# Patient Record
Sex: Male | Born: 1970 | Race: White | Hispanic: No | Marital: Married | Smoking: Former smoker
Health system: Southern US, Community
[De-identification: ages and names within clinical notes are randomized; demographics above are authoritative.]

## PROBLEM LIST (undated history)

## (undated) DIAGNOSIS — K829 Disease of gallbladder, unspecified: Secondary | ICD-10-CM

## (undated) HISTORY — PX: OTHER SURGICAL HISTORY: SHX169

## (undated) HISTORY — PX: JOINT REPLACEMENT: SHX530

## (undated) HISTORY — PX: EYE SURGERY: SHX253

## (undated) HISTORY — DX: Disease of gallbladder, unspecified: K82.9

---

## 2007-08-30 HISTORY — PX: ESOPHAGOGASTRODUODENOSCOPY: SHX1529

## 2018-05-08 DIAGNOSIS — M79644 Pain in right finger(s): Secondary | ICD-10-CM | POA: Diagnosis not present

## 2018-06-26 ENCOUNTER — Ambulatory Visit: Payer: BLUE CROSS/BLUE SHIELD | Admitting: Family Medicine

## 2018-06-26 ENCOUNTER — Encounter: Payer: Self-pay | Admitting: Family Medicine

## 2018-06-26 ENCOUNTER — Ambulatory Visit (INDEPENDENT_AMBULATORY_CARE_PROVIDER_SITE_OTHER): Payer: BLUE CROSS/BLUE SHIELD

## 2018-06-26 VITALS — BP 130/80 | HR 86 | Ht 73.0 in | Wt 174.0 lb

## 2018-06-26 DIAGNOSIS — S6991XA Unspecified injury of right wrist, hand and finger(s), initial encounter: Secondary | ICD-10-CM

## 2018-06-26 DIAGNOSIS — M79644 Pain in right finger(s): Secondary | ICD-10-CM | POA: Diagnosis not present

## 2018-06-26 MED ORDER — DICLOFENAC SODIUM 1 % TD GEL: TRANSDERMAL | 1 refills | Status: DC

## 2018-06-26 NOTE — Progress Notes (Signed)
   Subjective:  Patient ID: Clinton Pope, male    DOB: 1971/05/28  Age: 47 y.o. MRN: [id number]  CC: Establish Care   HPI Clinton Pope presents for treatment and evaluation for pain in his right middle PIP joint.  There was no injury to this joint that he can think of.  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  He was evaluated for this back in June.  X-rays were normal.  He is taken time off from golfing and then returned only to find that it was difficult to complete his round secondary to the pain.  It sounds as though he took a course of prednisone for the pain that did help at the time.  This is been going on for 4 months for him without resolve.   No outpatient medications prior to visit.   No facility-administered medications prior to visit.     ROS Review of Systems  Respiratory: Negative.   Cardiovascular: Negative.   Gastrointestinal: Negative.   Musculoskeletal: Positive for arthralgias.  Skin: Negative for pallor and rash.  Neurological: Negative for weakness and numbness.  Hematological: Negative.   Psychiatric/Behavioral: Negative.     Objective:  BP 130/80   Pulse 86   Ht 6\' 1"  (1.854 m)   Wt 174 lb (78.9 kg)   SpO2 95%   BMI 22.96 kg/m   BP Readings from Last 3 Encounters:  06/26/18 130/80    Wt Readings from Last 3 Encounters:  06/26/18 174 lb (78.9 kg)    Physical Exam  Constitutional: He is oriented to person, place, and time. He appears well-developed and well-nourished. No distress.  HENT:  Head: Normocephalic and atraumatic.  Right Ear: External ear normal.  Left Ear: External ear normal.  Eyes: Right eye exhibits no discharge. Left eye exhibits no discharge. No scleral icterus.  Pulmonary/Chest: Effort normal.  Musculoskeletal:       Hands: Neurological: He is alert and oriented to person, place, and time.  Skin: Skin is warm and dry. Capillary refill takes less than 2 seconds. No rash noted. He is not diaphoretic.    Psychiatric: He has a normal mood and affect. His behavior is normal.    No results found for: WBC, HGB, HCT, PLT, GLUCOSE, CHOL, TRIG, HDL, LDLDIRECT, LDLCALC, ALT, AST, NA, K, CL, CREATININE, BUN, CO2, TSH, PSA, INR, GLUF, HGBA1C, MICROALBUR  Patient was never admitted.  Assessment & Plan:   Clinton Pope was seen today for establish care.  Diagnoses and all orders for this visit:  Injury of right middle finger, initial encounter -     DG Finger Middle Right; Future -     diclofenac sodium (VOLTAREN) 1 % GEL; Apply a small amount to tender spot on right middle finger 4 times daily as needed. -     Ambulatory referral to Hand Surgery -     DG Finger Middle Right   I am having Clinton Pope start on diclofenac sodium.  Meds ordered this encounter  Medications  . diclofenac sodium (VOLTAREN) 1 % GEL    Sig: Apply a small amount to tender spot on right middle finger 4 times daily as needed.    Dispense:  100 g    Refill:  1     Follow-up: Return return for physical exam. .  Libby Maw, MD

## 2018-06-26 NOTE — Patient Instructions (Signed)
Finger Sprain A finger sprain is an injury to one of the strong bands of tissue (ligaments) that connect the bones in the finger. The ligament can be stretched too much, or it can tear. A tear can be either partial or complete. The severity of the sprain depends on how much of the ligament was damaged or torn. CAUSES This injury is often caused by a fall or an accident. For example, if you extend your hands to catch an object or to protect yourself during a fall, the force of impact may cause the ligaments in your finger to stretch too much. RISK FACTORS The following factors may make you more likely to have this injury:  Playing sports that involve a greater risk of falling, such as skiing.  Playing sports that involve catching an object, such as basketball.  Having poor strength and flexibility. SYMPTOMS Symptoms of this condition include:  Pain at the affected finger joint, especially when bending or extending the finger.  Loss of motion in the finger.  Swelling.  Tenderness.  Bruising. DIAGNOSIS This condition is diagnosed with a medical history and physical exam. You may also have an X-ray of your finger to rule out a fracture or dislocation. TREATMENT Treatment varies depending on the severity of the sprain. If your ligament is overstretched or partially torn, treatment usually involves:  Keeping the finger in a fixed position (immobilization) for a period of time. To help you do this, your health care provider may apply a bandage, splint, or cast to keep the finger from moving until it heals. In some cases, the finger may be taped to the fingers beside it (buddy taping).  Taking medicines for pain.  Doing exercises for the finger after it has begun to heal. If your ligament is fully torn, you may need surgery to reconnect the ligament to the bone. After surgery, a cast or splint will be applied. HOME CARE INSTRUCTIONS If You Have a Splint:  Wear the splint as told by your  health care provider. Remove it only as told by your health care provider.  Loosen the splint if your fingers tingle, become numb, or turn cold and blue.  Do not let your splint get wet if it is not waterproof.  Keep the splint clean. If You Have a Cast:  Do not stick anything inside the cast to scratch your skin. Doing that increases your risk of infection.  Check the skin around the cast every day. Tell your health care provider about any concerns.  You may put lotion on dry skin around the edges of the cast. Do not put lotion on the skin underneath the cast.  Do not let your cast get wet if it is not waterproof.  Keep the cast clean. Bathing  If your splint or cast is not waterproof, cover it with a watertight plastic bag when you take a bath or a shower.  Keep any bandages (dressings) dry until your health care provider says they can be removed. Managing Pain, Stiffness, and Swelling  If directed, put ice on the injured area: ? Put ice in a plastic bag. ? Place a towel between your skin and the bag. ? Leave the ice on for 20 minutes, 2-3 times a day.  Move your fingers often to avoid stiffness and to lessen swelling.  Raise (elevate) the injured area above the level of your heart while you are sitting or lying down. General Instructions  Do not put pressure on any part of the  cast or splint until it is fully hardened. This may take several hours.  Take over-the-counter and prescription medicines only as told by your health care provider.  Do not drive or operate heavy machinery while taking prescription pain medicine.  Do exercises as told by your health care provider or physical therapist.  Do not wear rings on your injured finger.  Keep all follow-up visits as told by your health care provider. This is important. SEEK MEDICAL CARE IF:  Your pain is not controlled with medicine.  Your bruising or swelling gets worse.  Your cast or splint is damaged.  Your  finger is numb or blue.  Your finger feels colder than normal. This information is not intended to replace advice given to you by your health care provider. Make sure you discuss any questions you have with your health care provider. Document Released: 09/22/2004 Document Revised: 12/07/2015 Document Reviewed: 06/25/2015 Elsevier Interactive Patient Education  2017 [employer].

## 2018-07-02 ENCOUNTER — Encounter (INDEPENDENT_AMBULATORY_CARE_PROVIDER_SITE_OTHER): Payer: Self-pay | Admitting: Family Medicine

## 2018-07-02 ENCOUNTER — Ambulatory Visit (INDEPENDENT_AMBULATORY_CARE_PROVIDER_SITE_OTHER): Payer: BLUE CROSS/BLUE SHIELD | Admitting: Family Medicine

## 2018-07-02 DIAGNOSIS — M79644 Pain in right finger(s): Secondary | ICD-10-CM

## 2018-07-02 NOTE — Progress Notes (Signed)
   Office Visit Note   Patient: Clinton Pope           Date of Birth: [birth year]           MRN: [id number] Visit Date: 07/02/2018 Requested by: Libby Maw, [address] [place], [place] [address] PCP: Libby Maw, MD  Subjective: Chief Complaint  Patient presents with  . Right Middle Finger - Pain    Pain since June 2019.  NKI.  Pain with gripping objects (carrying groceries, gripping a golf club).    HPI: He is a 47 year old right-hand-dominant male with right third finger pain.  Symptoms started 3 or 4 months ago without injury.  Pain on the ulnar side of his third finger PIP joint.  It hurts to squeeze objects, or when his finger is jerked in radial direction.  Occasional stiffness in his finger.  He tried prednisone for 12 days with temporary improvement.  No previous problems with his finger, denies any numbness or tingling.  He works in Engineer, technical sales.  He plays golf as well.  He stopped golf for a month but it did not help his pain.  He had x-rays done recently.              ROS: Negative for rheumatologic disease.  Objective: Vital Signs: There were no vitals taken for this visit.  Physical Exam:  Right hand: Third finger has full active range of motion, no synovitis.  He is tender to palpation on the radial side of the PIP joint.  He has pain with stress of the radial collateral ligament but no laxity.  No triggering at the A1 pulley.  Imaging: Plain x-rays reviewed and were unremarkable.  Ultrasound images obtained showing a probable partial tear of the radial collateral ligament of the PIP joint at the middle phalanx.  Question tiny avulsion fragment as well.  Assessment & Plan: 1.  Right third finger pain, suspect sprain of the radial collateral ligament PIP joint with small partial tear -We will try silicone sleeve over the joint and buddy tape to the fourth finger for the next 3 to 4 weeks.  If symptoms improve he will then try to resume his  normal activities.  If pain recurs, then possibly occupational/hand therapy referral.   Follow-Up Instructions: No follow-ups on file.       Procedures: None today.   PMFS History: There are no active problems to display for this patient.  History reviewed. No pertinent past medical history.  Family History  Problem Relation Age of Onset  . Cancer Mother   . Early death Mother   . Early death Father     History reviewed. No pertinent surgical history. Social History   Occupational History  . Not on file  Tobacco Use  . Smoking status: Current Every Day Smoker  . Smokeless tobacco: Never Used  Substance and Sexual Activity  . Alcohol use: Yes  . Drug use: Never  . Sexual activity: Yes    Partners: Female

## 2018-07-12 ENCOUNTER — Encounter: Payer: Self-pay | Admitting: Family Medicine

## 2018-07-12 ENCOUNTER — Ambulatory Visit (INDEPENDENT_AMBULATORY_CARE_PROVIDER_SITE_OTHER): Payer: BLUE CROSS/BLUE SHIELD | Admitting: Family Medicine

## 2018-07-12 VITALS — BP 110/80 | HR 82 | Ht 73.0 in | Wt 172.1 lb

## 2018-07-12 DIAGNOSIS — Z720 Tobacco use: Secondary | ICD-10-CM | POA: Diagnosis not present

## 2018-07-12 DIAGNOSIS — Z0001 Encounter for general adult medical examination with abnormal findings: Secondary | ICD-10-CM | POA: Diagnosis not present

## 2018-07-12 DIAGNOSIS — H6123 Impacted cerumen, bilateral: Secondary | ICD-10-CM | POA: Diagnosis not present

## 2018-07-12 LAB — LIPID PANEL
CHOL/HDL RATIO: 6
Cholesterol: 220 mg/dL — ABNORMAL HIGH (ref 0–200)
HDL: 34 mg/dL — ABNORMAL LOW (ref >39.00)
NonHDL: 186.21
Triglycerides: 209 mg/dL — ABNORMAL HIGH (ref 0.0–149.0)
VLDL: 41.8 mg/dL — AB (ref 0.0–40.0)

## 2018-07-12 LAB — COMPREHENSIVE METABOLIC PANEL
ALK PHOS: 63 U/L (ref 39–117)
ALT: 25 U/L (ref 0–53)
AST: 18 U/L (ref 0–37)
Albumin: 4.5 g/dL (ref 3.5–5.2)
BUN: 12 mg/dL (ref 6–23)
CO2: 26 mEq/L (ref 19–32)
CREATININE: 0.91 mg/dL (ref 0.40–1.50)
Calcium: 9.4 mg/dL (ref 8.4–10.5)
Chloride: 107 mEq/L (ref 96–112)
GFR: 94.11 mL/min (ref >60.00)
GLUCOSE: 95 mg/dL (ref 70–99)
POTASSIUM: 4.3 meq/L (ref 3.5–5.1)
SODIUM: 142 meq/L (ref 135–145)
TOTAL PROTEIN: 6.9 g/dL (ref 6.0–8.3)
Total Bilirubin: 0.5 mg/dL (ref 0.2–1.2)

## 2018-07-12 LAB — URINALYSIS, ROUTINE W REFLEX MICROSCOPIC
Bilirubin Urine: NEGATIVE
HGB URINE DIPSTICK: NEGATIVE
Ketones, ur: NEGATIVE
Leukocytes, UA: NEGATIVE
Nitrite: NEGATIVE
Specific Gravity, Urine: 1.015 (ref 1.000–1.030)
TOTAL PROTEIN, URINE-UPE24: NEGATIVE
URINE GLUCOSE: NEGATIVE
UROBILINOGEN UA: 0.2 (ref 0.0–1.0)
WBC UA: NONE SEEN — AB (ref 0–?)
pH: 7 (ref 5.0–8.0)

## 2018-07-12 LAB — CBC
HEMATOCRIT: 47.6 % (ref 39.0–52.0)
Hemoglobin: 16.6 g/dL (ref 13.0–17.0)
MCHC: 34.9 g/dL (ref 30.0–36.0)
MCV: 89.3 fl (ref 78.0–100.0)
Platelets: 279 10*3/uL (ref 150.0–400.0)
RBC: 5.33 Mil/uL (ref 4.22–5.81)
RDW: 13.8 % (ref 11.5–15.5)
WBC: 8.1 10*3/uL (ref 4.0–10.5)

## 2018-07-12 LAB — LDL CHOLESTEROL, DIRECT: LDL DIRECT: 153 mg/dL

## 2018-07-12 LAB — TSH: TSH: 1.51 u[IU]/mL (ref 0.35–4.50)

## 2018-07-12 MED ORDER — CARBAMIDE PEROXIDE 6.5 % OT SOLN: 5.0000 [drp] | Freq: Two times a day (BID) | OTIC | 0 refills | Status: DC

## 2018-07-12 NOTE — Progress Notes (Signed)
Subjective:  Patient ID: Clinton Pope, male    DOB: Jun 17, 1971  Age: 47 y.o. MRN: [id number]  CC: Annual Exam   HPI Clinton Pope presents for a physical exam.  He is fasting today.  He lives with his wife.  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  His main physical activity is golfing.  He likes to walk.  He does not have a dedicated daily exercise program.  He has been unable to golf recently because of the weather and his above-stated problem with his right middle finger.  He is right-hand dominant.  He rarely if ever smokes alcohol.  He does not use illicit drugs.  He has been smoking about a pack of cigarettes a day for the last 30 years.  His wife also smokes.  He is not motivated to stop smoking at this time.  He has not tolerated nicotine lozenges or gum.  His mom died of breast cancer.  His father died at age 75 from a ruptured AAA.    Outpatient Medications Prior to Visit  Medication Sig Dispense Refill  . diclofenac sodium (VOLTAREN) 1 % GEL Apply a small amount to tender spot on right middle finger 4 times daily as needed. 100 g 1   No facility-administered medications prior to visit.     ROS Review of Systems  Constitutional: Negative for chills, diaphoresis, fatigue, fever and unexpected weight change.  HENT: Positive for congestion and postnasal drip. Negative for sinus pressure and sinus pain.   Eyes: Negative for photophobia and visual disturbance.  Respiratory: Negative for cough, shortness of breath and wheezing.   Cardiovascular: Negative for chest pain and palpitations.  Gastrointestinal: Negative.   Endocrine: Negative for polyphagia.  Genitourinary: Negative for difficulty urinating and frequency.  Musculoskeletal: Positive for arthralgias. Negative for joint swelling.  Skin: Negative for pallor and rash.  Allergic/Immunologic: Negative for immunocompromised state.  Neurological: Negative for speech difficulty, numbness and headaches.    Hematological: Does not bruise/bleed easily.  Psychiatric/Behavioral: Negative.     Objective:  BP 110/80 (BP Location: Left Arm, Patient Position: Sitting, Cuff Size: Normal)   Pulse 82   Ht _0  (1.854 m)   Wt 172 lb 2 oz (78.1 kg)   SpO2 97%   BMI 22.71 kg/m   BP Readings from Last 3 Encounters:  07/12/18 110/80  06/26/18 130/80    Wt Readings from Last 3 Encounters:  07/12/18 172 lb 2 oz (78.1 kg)  06/26/18 174 lb (78.9 kg)    Physical Exam  Constitutional: He is oriented to person, place, and time. He appears well-developed and well-nourished. No distress.  HENT:  Head: Normocephalic and atraumatic.  Right Ear: External ear normal. A foreign body is present.  Left Ear: External ear normal. A foreign body is present.  Ears:  Mouth/Throat: Oropharynx is clear and moist. No oropharyngeal exudate.  Eyes: Pupils are equal, round, and reactive to light. Conjunctivae and EOM are normal. Right eye exhibits no discharge. Left eye exhibits no discharge. No scleral icterus.  Neck: Neck supple. No JVD present. No tracheal deviation present. No thyromegaly present.  Cardiovascular: Normal rate, regular rhythm and normal heart sounds.  Pulmonary/Chest: Effort normal and breath sounds normal.  Abdominal: Soft. Bowel sounds are normal. He exhibits no distension and no mass. There is no tenderness. There is no rebound and no guarding. No hernia. Hernia confirmed negative in the right inguinal area and confirmed negative in the left inguinal area.  Genitourinary:  Testes normal and penis normal. Right testis shows no mass, no swelling and no tenderness. Left testis shows no mass, no swelling and no tenderness. Circumcised. No hypospadias, penile erythema or penile tenderness. No discharge found.  Musculoskeletal: He exhibits no edema.  Lymphadenopathy:    He has no cervical adenopathy. No inguinal adenopathy noted on the right or left side.  Neurological: He is alert and oriented to  person, place, and time.  Skin: Skin is warm and dry. Capillary refill takes less than 2 seconds. No rash noted. He is not diaphoretic. No erythema. No pallor.  Psychiatric: He has a normal mood and affect. His behavior is normal.    No results found for: WBC, HGB, HCT, PLT, GLUCOSE, CHOL, TRIG, HDL, LDLDIRECT, LDLCALC, ALT, AST, NA, K, CL, CREATININE, BUN, CO2, TSH, PSA, INR, GLUF, HGBA1C, MICROALBUR  Patient was never admitted.  Assessment & Plan:   [name] was seen today for annual exam.  Diagnoses and all orders for this visit:  Encounter for health maintenance examination with abnormal findings -     CBC -     Comprehensive metabolic panel -     HIV Antibody (routine testing w rflx) -     Lipid panel -     Urinalysis, Routine w reflex microscopic -     TSH  Ceruminosis, bilateral -     carbamide peroxide (EAR WAX REMOVAL KIT) 6.5 % OTIC solution; Place 5 drops into both ears 2 (two) times daily.  Tobacco use   I am having [name] start on carbamide peroxide. I am also having him maintain his diclofenac sodium.  Meds ordered this encounter  Medications  . carbamide peroxide (EAR WAX REMOVAL KIT) 6.5 % OTIC solution    Sig: Place 5 drops into both ears 2 (two) times daily.    Dispense:  15 mL    Refill:  0   Patient was given anticipatory guidance for health maintenance and disease prevention.  Encouraged him to begin a dedicated exercise program 30 minutes 5 days a week.  He was advised to obtain an earwax removal kit and start using the drops daily.  Information was given to him on cerumen buildup and avoiding Q-tips.  He was given information on the health risks of smoking and coping with quitting.  We discussed the use of Chantix and how it could help him.  He agrees that he and his wife together have to make a joint decision to stop smoking.  Suggested follow-up date will pending laboratory results.  Follow-up: No follow-ups on file.  Libby Maw,  MD

## 2018-07-12 NOTE — Patient Instructions (Signed)
Earwax Buildup, Adult The ears produce a substance called earwax that helps keep bacteria out of the ear and protects the skin in the ear canal. Occasionally, earwax can build up in the ear and cause discomfort or hearing loss. What increases the risk? This condition is more likely to develop in people who:  Are male.  Are elderly.  Naturally produce more earwax.  Clean their ears often with cotton swabs.  Use earplugs often.  Use in-ear headphones often.  Wear hearing aids.  Have narrow ear canals.  Have earwax that is overly thick or sticky.  Have eczema.  Are dehydrated.  Have excess hair in the ear canal.  What are the signs or symptoms? Symptoms of this condition include:  Reduced or muffled hearing.  A feeling of fullness in the ear or feeling that the ear is plugged.  Fluid coming from the ear.  Ear pain.  Ear itch.  Ringing in the ear.  Coughing.  An obvious piece of earwax that can be seen inside the ear canal.  How is this diagnosed? This condition may be diagnosed based on:  Your symptoms.  Your medical history.  An ear exam. During the exam, your health care provider will look into your ear with an instrument called an otoscope.  You may have tests, including a hearing test. How is this treated? This condition may be treated by:  Using ear drops to soften the earwax.  Having the earwax removed by a health care provider. The health care provider may: ? Flush the ear with water. ? Use an instrument that has a loop on the end (curette). ? Use a suction device.  Surgery to remove the wax buildup. This may be done in severe cases.  Follow these instructions at home:  Take over-the-counter and prescription medicines only as told by your health care provider.  Do not put any objects, including cotton swabs, into your ear. You can clean the opening of your ear canal with a washcloth or facial tissue.  Follow instructions from your health  care provider about cleaning your ears. Do not over-clean your ears.  Drink enough fluid to keep your urine clear or pale yellow. This will help to thin the earwax.  Keep all follow-up visits as told by your health care provider. If earwax builds up in your ears often or if you use hearing aids, consider seeing your health care provider for routine, preventive ear cleanings. Ask your health care provider how often you should schedule your cleanings.  If you have hearing aids, clean them according to instructions from the manufacturer and your health care provider. Contact a health care provider if:  You have ear pain.  You develop a fever.  You have blood, pus, or other fluid coming from your ear.  You have hearing loss.  You have ringing in your ears that does not go away.  Your symptoms do not improve with treatment.  You feel like the room is spinning (vertigo). Summary  Earwax can build up in the ear and cause discomfort or hearing loss.  The most common symptoms of this condition include reduced or muffled hearing and a feeling of fullness in the ear or feeling that the ear is plugged.  This condition may be diagnosed based on your symptoms, your medical history, and an ear exam.  This condition may be treated by using ear drops to soften the earwax or by having the earwax removed by a health care provider.  Do   not put any objects, including cotton swabs, into your ear. You can clean the opening of your ear canal with a washcloth or facial tissue. This information is not intended to replace advice given to you by your health care provider. Make sure you discuss any questions you have with your health care provider. Document Released: 09/22/2004 Document Revised: 10/26/2016 Document Reviewed: 10/26/2016 Elsevier Interactive Patient Education  2018 [place] Maintenance, Male A healthy lifestyle and preventive care is important for your health and wellness. Ask  your health care provider about what schedule of regular examinations is right for you. What should I know about weight and diet? Eat a Healthy Diet  Eat plenty of vegetables, fruits, whole grains, low-fat dairy products, and lean protein.  Do not eat a lot of foods high in solid fats, added sugars, or salt.  Maintain a Healthy Weight Regular exercise can help you achieve or maintain a healthy weight. You should:  Do at least 150 minutes of exercise each week. The exercise should increase your heart rate and make you sweat (moderate-intensity exercise).  Do strength-training exercises at least twice a week.  Watch Your Levels of Cholesterol and Blood Lipids  Have your blood tested for lipids and cholesterol every 5 years starting at 49 years of age. If you are at high risk for heart disease, you should start having your blood tested when you are 49 years old. You may need to have your cholesterol levels checked more often if: ? Your lipid or cholesterol levels are high. ? You are older than 49 years of age. ? You are at high risk for heart disease.  What should I know about cancer screening? Many types of cancers can be detected early and may often be prevented. Lung Cancer  You should be screened every year for lung cancer if: ? You are a current smoker who has smoked for at least 30 years. ? You are a former smoker who has quit within the past 15 years.  Talk to your health care provider about your screening options, when you should start screening, and how often you should be screened.  Colorectal Cancer  Routine colorectal cancer screening usually begins at 49 years of age and should be repeated every 5-10 years until you are 49 years old. You may need to be screened more often if early forms of precancerous polyps or small growths are found. Your health care provider may recommend screening at an earlier age if you have risk factors for colon cancer.  Your health care  provider may recommend using home test kits to check for hidden blood in the stool.  A small camera at the end of a tube can be used to examine your colon (sigmoidoscopy or colonoscopy). This checks for the earliest forms of colorectal cancer.  Prostate and Testicular Cancer  Depending on your age and overall health, your health care provider may do certain tests to screen for prostate and testicular cancer.  Talk to your health care provider about any symptoms or concerns you have about testicular or prostate cancer.  Skin Cancer  Check your skin from head to toe regularly.  Tell your health care provider about any new moles or changes in moles, especially if: ? There is a change in a mole's size, shape, or color. ? You have a mole that is larger than a pencil eraser.  Always use sunscreen. Apply sunscreen liberally and repeat throughout the day.  Protect yourself by wearing long  sleeves, pants, a wide-brimmed hat, and sunglasses when outside.  What should I know about heart disease, diabetes, and high blood pressure?  If you are 28-80 years of age, have your blood pressure checked every 3-5 years. If you are 70 years of age or older, have your blood pressure checked every year. You should have your blood pressure measured twice-once when you are at a hospital or clinic, and once when you are not at a hospital or clinic. Record the average of the two measurements. To check your blood pressure when you are not at a hospital or clinic, you can use: ? An automated blood pressure machine at a pharmacy. ? A home blood pressure monitor.  Talk to your health care provider about your target blood pressure.  If you are between 28-32 years old, ask your health care provider if you should take aspirin to prevent heart disease.  Have regular diabetes screenings by checking your fasting blood sugar level. ? If you are at a normal weight and have a low risk for diabetes, have this test once every  three years after the age of 38. ? If you are overweight and have a high risk for diabetes, consider being tested at a younger age or more often.  A one-time screening for abdominal aortic aneurysm (AAA) by ultrasound is recommended for men aged 31-75 years who are current or former smokers. What should I know about preventing infection? Hepatitis B If you have a higher risk for hepatitis B, you should be screened for this virus. Talk with your health care provider to find out if you are at risk for hepatitis B infection. Hepatitis C Blood testing is recommended for:  Everyone born from [birth year] through 1967.  Anyone with known risk factors for hepatitis C.  Sexually Transmitted Diseases (STDs)  You should be screened each year for STDs including gonorrhea and chlamydia if: ? You are sexually active and are younger than 49 years of age. ? You are older than 49 years of age and your health care provider tells you that you are at risk for this type of infection. ? Your sexual activity has changed since you were last screened and you are at an increased risk for chlamydia or gonorrhea. Ask your health care provider if you are at risk.  Talk with your health care provider about whether you are at high risk of being infected with HIV. Your health care provider may recommend a prescription medicine to help prevent HIV infection.  What else can I do?  Schedule regular health, dental, and eye exams.  Stay current with your vaccines (immunizations).  Do not use any tobacco products, such as cigarettes, chewing tobacco, and e-cigarettes. If you need help quitting, ask your health care provider.  Limit alcohol intake to no more than 2 drinks per day. One drink equals 12 ounces of beer, 5 ounces of wine, or 1 ounces of hard liquor.  Do not use street drugs.  Do not share needles.  Ask your health care provider for help if you need support or information about quitting drugs.  Tell your health  care provider if you often feel depressed.  Tell your health care provider if you have ever been abused or do not feel safe at home. This information is not intended to replace advice given to you by your health care provider. Make sure you discuss any questions you have with your health care provider. Document Released: 02/11/2008 Document Revised: 04/13/2016 Document Reviewed: 05/19/2015  Chartered certified accountant Patient Education  2018 [place] Risks of Smoking Smoking cigarettes is very bad for your health. Tobacco smoke has over 200 known poisons in it. It contains the poisonous gases nitrogen oxide and carbon monoxide. There are over 60 chemicals in tobacco smoke that cause cancer. Smoking is difficult to quit because a chemical in tobacco, called nicotine, causes addiction or dependence. When you smoke and inhale, nicotine is absorbed rapidly into the bloodstream through your lungs. Both inhaled and non-inhaled nicotine may be addictive. What are the risks of cigarette smoke? Cigarette smokers have an increased risk of many serious medical problems, including:  Lung cancer.  Lung disease, such as pneumonia, bronchitis, and emphysema.  Chest pain (angina) and heart attack because the heart is not getting enough oxygen.  Heart disease and peripheral blood vessel disease.  High blood pressure (hypertension).  Stroke.  Oral cancer, including cancer of the lip, mouth, or voice box.  Bladder cancer.  Pancreatic cancer.  Cervical cancer.  Pregnancy complications, including premature birth.  Stillbirths and smaller newborn babies, birth defects, and genetic damage to sperm.  Early menopause.  Lower estrogen level for women.  Infertility.  Facial wrinkles.  Blindness.  Increased risk of broken bones (fractures).  Senile dementia.  Stomach ulcers and internal bleeding.  Delayed wound healing and increased risk of complications during surgery.  Even smoking  lightly shortens your life expectancy by several years.  Because of secondhand smoke exposure, children of smokers have an increased risk of the following:  Sudden infant death syndrome (SIDS).  Respiratory infections.  Lung cancer.  Heart disease.  Ear infections.  What are the benefits of quitting? There are many health benefits of quitting smoking. Here are some of them:  Within days of quitting smoking, your risk of having a heart attack decreases, your blood flow improves, and your lung capacity improves. Blood pressure, pulse rate, and breathing patterns start returning to normal soon after quitting.  Within months, your lungs may clear up completely.  Quitting for 10 years reduces your risk of developing lung cancer and heart disease to almost that of a nonsmoker.  People who quit may see an improvement in their overall quality of life.  How do I quit smoking? Smoking is an addiction with both physical and psychological effects, and longtime habits can be hard to change. Your health care provider can recommend:  Programs and community resources, which may include group support, education, or talk therapy.  Prescription medicines to help reduce cravings.  Nicotine replacement products, such as patches, gum, and nasal sprays. Use these products only as directed. Do not replace cigarette smoking with electronic cigarettes, which are commonly called e-cigarettes. The safety of e-cigarettes is not known, and some may contain harmful chemicals.  A combination of two or more of these methods.  Where to find more information:  American Lung Association: www.lung.org  American Cancer Society: www.cancer.org Summary  Smoking cigarettes is very bad for your health. Cigarette smokers have an increased risk of many serious medical problems, including several cancers, heart disease, and stroke.  Smoking is an addiction with both physical and psychological effects, and longtime  habits can be hard to change.  By stopping right away, you can greatly reduce the risk of medical problems for you and your family.  To help you quit smoking, your health care provider can recommend programs, community resources, prescription medicines, and nicotine replacement products such as patches, gum, and nasal sprays. This information is not  intended to replace advice given to you by your health care provider. Make sure you discuss any questions you have with your health care provider. Document Released: 09/22/2004 Document Revised: 08/19/2016 Document Reviewed: 08/19/2016 Elsevier Interactive Patient Education  2017 [place] with Quitting Smoking Quitting smoking is a physical and mental challenge. You will face cravings, withdrawal symptoms, and temptation. Before quitting, work with your health care provider to make a plan that can help you cope. Preparation can help you quit and keep you from giving in. How can I cope with cravings? Cravings usually last for 5-10 minutes. If you get through it, the craving will pass. Consider taking the following actions to help you cope with cravings:  Keep your mouth busy: ? Chew sugar-free gum. ? Suck on hard candies or a straw. ? Brush your teeth.  Keep your hands and body busy: ? Immediately change to a different activity when you feel a craving. ? Squeeze or play with a ball. ? Do an activity or a hobby, like making bead jewelry, practicing needlepoint, or working with wood. ? Mix up your normal routine. ? Take a short exercise break. Go for a quick walk or run up and down stairs. ? Spend time in public places where smoking is not allowed.  Focus on doing something kind or helpful for someone else.  Call a friend or family member to talk during a craving.  Join a support group.  Call a quit line, such as 1-800-QUIT-NOW.  Talk with your health care provider about medicines that might help you cope with cravings and  make quitting easier for you.  How can I deal with withdrawal symptoms? Your body may experience negative effects as it tries to get used to not having nicotine in the system. These effects are called withdrawal symptoms. They may include:  Feeling hungrier than normal.  Trouble concentrating.  Irritability.  Trouble sleeping.  Feeling depressed.  Restlessness and agitation.  Craving a cigarette.  To manage withdrawal symptoms:  Avoid places, people, and activities that trigger your cravings.  Remember why you want to quit.  Get plenty of sleep.  Avoid coffee and other caffeinated drinks. These may worsen some of your symptoms.  How can I handle social situations? Social situations can be difficult when you are quitting smoking, especially in the first few weeks. To manage this, you can:  Avoid parties, bars, and other social situations where people might be smoking.  Avoid alcohol.  Leave right away if you have the urge to smoke.  Explain to your family and friends that you are quitting smoking. Ask for understanding and support.  Plan activities with friends or family where smoking is not an option.  What are some ways I can cope with stress? Wanting to smoke may cause stress, and stress can make you want to smoke. Find ways to manage your stress. Relaxation techniques can help. For example:  Breathe slowly and deeply, in through your nose and out through your mouth.  Listen to soothing, relaxing music.  Talk with a family member or friend about your stress.  Light a candle.  Soak in a bath or take a shower.  Think about a peaceful place.  What are some ways I can prevent weight gain? Be aware that many people gain weight after they quit smoking. However, not everyone does. To keep from gaining weight, have a plan in place before you quit and stick to the plan after you quit. Your plan  should include:  Having healthy snacks. When you have a craving, it  may help to: ? Eat plain popcorn, crunchy carrots, celery, or other cut vegetables. ? Chew sugar-free gum.  Changing how you eat: ? Eat small portion sizes at meals. ? Eat 4-6 small meals throughout the day instead of 1-2 large meals a day. ? Be mindful when you eat. Do not watch television or do other things that might distract you as you eat.  Exercising regularly: ? Make time to exercise each day. If you do not have time for a long workout, do short bouts of exercise for 5-10 minutes several times a day. ? Do some form of strengthening exercise, like weight lifting, and some form of aerobic exercise, like running or swimming.  Drinking plenty of water or other low-calorie or no-calorie drinks. Drink 6-8 glasses of water daily, or as much as instructed by your health care provider.  Summary  Quitting smoking is a physical and mental challenge. You will face cravings, withdrawal symptoms, and temptation to smoke again. Preparation can help you as you go through these challenges.  You can cope with cravings by keeping your mouth busy (such as by chewing gum), keeping your body and hands busy, and making calls to family, friends, or a helpline for people who want to quit smoking.  You can cope with withdrawal symptoms by avoiding places where people smoke, avoiding drinks with caffeine, and getting plenty of rest.  Ask your health care provider about the different ways to prevent weight gain, avoid stress, and handle social situations. This information is not intended to replace advice given to you by your health care provider. Make sure you discuss any questions you have with your health care provider. Document Released: 08/12/2016 Document Revised: 08/12/2016 Document Reviewed: 08/12/2016 Elsevier Interactive Patient Education  [employer].

## 2018-07-13 LAB — HIV ANTIBODY (ROUTINE TESTING W REFLEX): HIV: NONREACTIVE

## 2018-07-23 ENCOUNTER — Other Ambulatory Visit (INDEPENDENT_AMBULATORY_CARE_PROVIDER_SITE_OTHER): Payer: Self-pay | Admitting: Family Medicine

## 2018-07-23 ENCOUNTER — Encounter (INDEPENDENT_AMBULATORY_CARE_PROVIDER_SITE_OTHER): Payer: Self-pay | Admitting: Family Medicine

## 2018-07-23 DIAGNOSIS — M79644 Pain in right finger(s): Secondary | ICD-10-CM

## 2018-07-24 ENCOUNTER — Other Ambulatory Visit (INDEPENDENT_AMBULATORY_CARE_PROVIDER_SITE_OTHER): Payer: Self-pay

## 2018-07-24 ENCOUNTER — Telehealth (INDEPENDENT_AMBULATORY_CARE_PROVIDER_SITE_OTHER): Payer: Self-pay | Admitting: Family Medicine

## 2018-07-24 NOTE — Telephone Encounter (Signed)
Yes, I believe they mean occupational/hand therapy.  Let's try the [facility] location near here instead.

## 2018-07-24 NOTE — Telephone Encounter (Signed)
OT referral amended.  Hard copy will be given to the patient at tomorrow morning's appointment with Dr. Junius Roads.

## 2018-07-24 NOTE — Telephone Encounter (Signed)
It may be that they do not provide occupational therapy?

## 2018-07-24 NOTE — Telephone Encounter (Signed)
[facility] called stating that they received a referral for Outpatient PT.  They wanted to let us know that they do not do Outpatient PT.  Thank you.

## 2018-07-25 ENCOUNTER — Ambulatory Visit (INDEPENDENT_AMBULATORY_CARE_PROVIDER_SITE_OTHER): Payer: BLUE CROSS/BLUE SHIELD | Admitting: Family Medicine

## 2018-07-25 ENCOUNTER — Encounter (INDEPENDENT_AMBULATORY_CARE_PROVIDER_SITE_OTHER): Payer: Self-pay | Admitting: Family Medicine

## 2018-07-25 DIAGNOSIS — M79644 Pain in right finger(s): Secondary | ICD-10-CM | POA: Diagnosis not present

## 2018-07-25 NOTE — Progress Notes (Signed)
   Office Visit Note   Patient: Clinton Pope           Date of Birth: [birth year]           MRN: [id number] Visit Date: 07/25/2018 Requested by: Libby Maw, [address], [place] [address] PCP: Libby Maw, MD  Subjective: Chief Complaint  Patient presents with  . Right Middle Finger - Pain, Follow-up    HPI: He is here with persistent right third finger pain.  My previous note said radial side pain, but his pain is actually on the ulnar side of his third finger PIP joint.  He has not yet been able to start hand therapy.  Silicone sleeve did not make any difference.              ROS: Noncontributory  Objective: Vital Signs: There were no vitals taken for this visit.  Physical Exam:  Right hand: Third finger is point tender on the ulnar side of his PIP joint.  No pain with stress of the ulnar collateral ligament.  Full flexion and extension compared to the other fingers.  Imaging: None today.  Assessment & Plan: 1.  Persistent right third finger pain roughly 4 months since onset -He will try hand therapy.  If not improving, we could contemplate hand surgery referral versus a one-time cortisone injection.   Follow-Up Instructions: No follow-ups on file.      Procedures: No procedures performed  No notes on file    PMFS History: Patient Active Problem List   Diagnosis Date Noted  . Encounter for health maintenance examination with abnormal findings 07/12/2018  . 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 Tobacco use 07/12/2018   History reviewed. No pertinent past medical history.  Family History  Problem Relation Age of Onset  . Cancer Mother   . Early death Mother   . Early death Father     History reviewed. No pertinent surgical history. Social History   Occupational History  . Not on file  Tobacco Use  . Smoking status: Current Every Day Smoker    Packs/day: 1.00    Years: 30.00    Pack years: 30.00  . Smokeless  tobacco: Never Used  Substance and Sexual Activity  . Alcohol use: Yes    Comment: RARELY  . Drug use: Never  . Sexual activity: Yes    Partners: Female

## 2018-07-31 ENCOUNTER — Other Ambulatory Visit: Payer: Self-pay

## 2018-07-31 ENCOUNTER — Ambulatory Visit: Payer: BLUE CROSS/BLUE SHIELD | Attending: Family Medicine | Admitting: Occupational Therapy

## 2018-07-31 ENCOUNTER — Encounter: Payer: Self-pay | Admitting: Occupational Therapy

## 2018-07-31 DIAGNOSIS — M25641 Stiffness of right hand, not elsewhere classified: Secondary | ICD-10-CM | POA: Diagnosis not present

## 2018-07-31 DIAGNOSIS — M79644 Pain in right finger(s): Secondary | ICD-10-CM | POA: Diagnosis not present

## 2018-07-31 NOTE — Therapy (Signed)
[facility] [address] [place], [place], [address] Phone: [phone]   Fax:  [phone]  Occupational Therapy Evaluation  Patient Details  Name: Clinton Pope MRN: [id number] Date of Birth: 04-10-1971 Referring Provider (OT): Dr. Eunice Blase   Encounter Date: 07/31/2018  OT End of Session - 07/31/18 2207    Visit Number  1    Number of Visits  7    Date for OT Re-Evaluation  10/29/18    Authorization Type  BC[employer], 60 visit limit OT, PT, ST (0 used)    OT Start Time  1322    OT Stop Time  1400    OT Time Calculation (min)  38 min    Activity Tolerance  Patient tolerated treatment well    Behavior During Therapy  [facility] for tasks assessed/performed       History reviewed. No pertinent past medical history.  History reviewed. No pertinent surgical history.  There were no vitals filed for this visit.  Subjective Assessment - 07/31/18 1325    Subjective   Pt reports that his finger has been hurting intermittently since June    Pertinent History  R 3rd digit PIP radial collateral ligament sprain    Patient Stated Goals  decr pain during resistive/forceful activities and activities involving vibration    Currently in Pain?  No/denies   no at rest, can be up to 9/10 at times with forceful/resistive activities   Pain Score  0-No pain    Pain Location  Finger (Comment which one)   3rd digit PIP   Pain Orientation  Lateral;Right    Pain Descriptors / Indicators  Throbbing    Pain Type  Acute pain    Pain Frequency  Intermittent    Aggravating Factors   golf, carrying heavy objects that pull on fingers (like bag), impact, vibration    Pain Relieving Factors  rest        [facility] OT Assessment - 07/31/18 0001      Assessment   Medical Diagnosis  R 3rd digit radial collateral ligament sprain    Referring Provider (OT)  Dr. Legrand Como Hilts    Onset Date/Surgical Date  --   June 2019   Hand Dominance  Right    Prior Therapy  none, trial of buddy tape as well as silicone sleeve by MD      Precautions   Precautions  None      Balance Screen   Has the patient fallen in the past 6 months  No      Home  Environment   Family/patient expects to be discharged to:  Private residence      Prior Function   Level of Independence  Independent    Vocation  Full time employment    Vocation Requirements  works in [employer], yardwork      ADL   ADL comments  all BADLs and IADLs mod I.  Pt reports pain with playing golf, carrying bag of groceries in R hand, carrying items with weight, using power washer/leaf blower (pain with vibration), pain with impact      Mobility   Mobility Status  Independent      Written Expression   Dominant Hand  Right      Coordination   Coordination  WNL      Edema   Edema  ? very mimimal at PIP      ROM / Strength   AROM /  PROM / Strength  AROM      Palpation   Palpation comment  no pain to touch, but can feel very small harder/thicker area felt at lateral aspect of PIP (ulnar side) per pt and palpation      AROM   Overall AROM   Within functional limits for tasks performed    Overall AROM Comments  pt reports stiffness particularly in the am, when he was wearing silicone sleeve (d/c approx 1 week ago).  However, WNL today with flex/ext   was previously buddy taping below PIP     Hand Function   Right Hand Grip (lbs)  93    Left Hand Grip (lbs)  95         Treatment:     Splinting:  Gutter finger splint fabricated for R 3rd digit providing support to lateral PIP joint for nighttime wear.  Pt issued 2 buddy straps and instructed on how to don 1 above and 1 below PIP joint with buddy strap on 3rd-4th digits  Pt instructed to perform PIP flex/ext x10, at least 4x day with lateral aspects of PIP stabilized.   Pt instructed to avoid activities that include impact, heavy lifting, force/resistance.  Pt verbalized understanding.           OT  Education - 07/31/18 2203    Education Details  Gutter finger splint wear (nighttime); buddy strap use (1 above and 1 below PIP joint, 3rd digit to 4th digit); avoid forceful, impact activities, heavy activities or activities that aggravate;  OT eval results, typical protocol, and POC; follow up in approx 4 weeks (call for splint adjustments or prn if needed sooner);  Instructed to remove splint/buddy straps and stabilize lateral aspects of PIP joint and perform 10 reps of finger flex/ext 4x day to decr stiffness    Person(s) Educated  Patient    Methods  Explanation;Demonstration;Verbal cues    Comprehension  Verbalized understanding       OT Short Term Goals - 07/31/18 2232      OT SHORT TERM GOAL #1   Title  Pt will be independent with nighttime splint and daytime buddy straps wear/care.--check STG 08/31/18    Time  --    Period  --    Status  New      OT SHORT TERM GOAL #2   Title  Pt will be independent with activity modification and PIP ROM.    Time  --    Period  --    Status  New        OT Long Term Goals - 07/31/18 2234      OT LONG TERM GOAL #1   Title  Pt will be able to carry groceries in dominant R hand with less than 2/10 pain.--check LTGs 10/15/17    Status  New      OT LONG TERM GOAL #2   Title  Pt will be able to play golf with less than 3/10 pain.    Status  New      OT LONG TERM GOAL #3   Title  Pt will be independent with updated HEP prn.    Status  New            Plan - 07/31/18 2222    Clinical Impression Statement  Pt is a 47 y.o. male referred to OT for R 3rd digit PIP radial collateral ligament sprain.  Pt with no significant PMH.  Pt presents with intermittent pain, stiffenss, and decr  dominant R hand functional use.  Pt would benefit from occupational therapy for splinting, estabilish HEP, and decr pain for improved dominant RUE functional use for ADLs/IADLs and leisure activities.     Occupational Profile and client history currently impacting  functional performance  Pt is independent, but dominant R hand/finger pain is limiting leisure activities and IADLs.    Occupational performance deficits (Please refer to evaluation for details):  ADL's;IADL's;Leisure;Social Participation    Rehab Potential  Good    OT Frequency  1x / week    OT Duration  6 weeks   +eval, will schedule next visit in approx 4 weeks (sooner prn if splint adjustments needed)   OT Treatment/Interventions  Self-care/ADL training;Cryotherapy;Paraffin;Therapeutic exercise;DME and/or AE instruction;Splinting;Manual Therapy;Neuromuscular education;Fluidtherapy;Ultrasound;Electrical Stimulation;Moist Heat;Iontophoresis;Contrast Bath;Passive range of motion;Therapeutic activities;Patient/family education    Plan  check splint prn, assess pain and need for upadates to HEP, schedule in approx 4 weeks to check pain (splint check/adjustment prn prior to 4 weeks)    Clinical Decision Making  Limited treatment options, no task modification necessary    Consulted and Agree with Plan of Care  Patient       Patient will benefit from skilled therapeutic intervention in order to improve the following deficits and impairments:  Pain, Decreased range of motion, Impaired UE functional use  Visit Diagnosis: Pain in right finger(s)  Stiffness of right hand, not elsewhere classified    Problem List Patient Active Problem List   Diagnosis Date Noted  . Encounter for health maintenance examination with abnormal findings 07/12/2018  . 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 Tobacco use 07/12/2018    [facility] 07/31/2018, 10:37 PM  [facility] [address] [place], [place], [address] Phone: [phone]   Fax:  [phone]  Name: Clinton Pope MRN: [id number] Date of Birth: 10/12/1970   Vianne Bulls, OTR/L [facility] [address] [place], [place]  [address] [phone]  phone [phone] 07/31/18 10:38 PM

## 2018-09-04 ENCOUNTER — Encounter (INDEPENDENT_AMBULATORY_CARE_PROVIDER_SITE_OTHER): Payer: Self-pay | Admitting: Family Medicine

## 2018-09-04 ENCOUNTER — Encounter: Payer: Self-pay | Admitting: Occupational Therapy

## 2018-09-04 ENCOUNTER — Ambulatory Visit: Payer: BLUE CROSS/BLUE SHIELD | Attending: Family Medicine | Admitting: Occupational Therapy

## 2018-09-04 DIAGNOSIS — M79644 Pain in right finger(s): Secondary | ICD-10-CM | POA: Diagnosis not present

## 2018-09-04 NOTE — Therapy (Signed)
[facility] [address] [place], [place], [address] Phone: [phone]   Fax:  [phone]  Occupational Therapy Treatment  Patient Details  Name: Clinton Pope MRN: [id number] Date of Birth: Dec 11, 1970 Referring Provider (OT): Dr. Eunice Blase   Encounter Date: 09/04/2018  OT End of Session - 09/04/18 1352    Visit Number  2    Number of Visits  7    Date for OT Re-Evaluation  10/29/18    Authorization Type  BCBS, 60 visit limit OT, PT, ST (0 used)    OT Start Time  1321    OT Stop Time  1334    OT Time Calculation (min)  13 min    Activity Tolerance  Patient tolerated treatment well    Behavior During Therapy  [facility] for tasks assessed/performed       History reviewed. No pertinent past medical history.  History reviewed. No pertinent surgical history.  There were no vitals filed for this visit.  Subjective Assessment - 09/04/18 1343    Subjective   Pt reports no change in pain. Has rested an wore buddy straps/splint consistently.  (was off work for 2.5 weeks)    Pertinent History  R 3rd digit PIP radial collateral ligament sprain    Patient Stated Goals  decr pain during resistive/forceful activities and activities involving vibration    Currently in Pain?  No/denies   only when carrying something heavy or with something with force       Assessed ROM of 3rd digit (WNL), and finger adduction/abduction strength (WNL) with some pain with abduction (at lateral 3rd digit PIP).  As pt has no strength or ROM deficits, did not add ex for HEP.    Discussed protocol for injury.  As, ROM/strength WNL and pt continues to report pain after immobilization/rest, recommended pt follow up with MD at this time.  Pt verbalized agreement.        OT Education - 09/04/18 1352    Education Details  Recommended pt schedule follow-up with MD due to continued pain    Person(s) Educated  Patient    Methods  Explanation    Comprehension  Verbalized understanding       OT Short Term Goals - 07/31/18 2232      OT SHORT TERM GOAL #1   Title  Pt will be independent with nighttime splint and daytime buddy straps wear/care.--check STG 08/31/18    Time  --    Period  --    Status  New      OT SHORT TERM GOAL #2   Title  Pt will be independent with activity modification and PIP ROM.    Time  --    Period  --    Status  New        OT Long Term Goals - 09/04/18 1355      OT LONG TERM GOAL #1   Title  Pt will be able to carry groceries in dominant R hand with less than 2/10 pain.--check LTGs 10/15/17    Status  Not Met   09/04/18:  no change     OT LONG TERM GOAL #2   Title  Pt will be able to play golf with less than 3/10 pain.    Status  Not Met   09/04/18:  hasn't attempted due to no change in pain     OT LONG TERM GOAL #3   Title  Pt will be independent with updated HEP  prn.    Status  Partially Met   09/04/18:  met with ROM ex, but did not progress to strengthening due to strength WNL and contninued pain           Plan - 09/04/18 1353    Clinical Impression Statement  Pt returns to occupational therapy today to check pain and see if pt would benefit from further HEP.  Pt reports no change in pain with rest/activity modifications, splinting, and use of buddy straps.  As strength/ROM WNL, recommended pt schedule follow-up appt with MD for continued pain.  Pt verbalized understanding/agreement.    Occupational Profile and client history currently impacting functional performance  Pt is independent, but dominant R hand/finger pain is limiting leisure activities and IADLs.    Occupational performance deficits (Please refer to evaluation for details):  ADL's;IADL's;Leisure;Social Participation    Rehab Potential  Good    OT Frequency  1x / week    OT Duration  6 weeks   +eval, will schedule next visit in approx 4 weeks (sooner prn if splint adjustments needed)   OT Treatment/Interventions  Self-care/ADL  training;Cryotherapy;Paraffin;Therapeutic exercise;DME and/or AE instruction;Splinting;Manual Therapy;Neuromuscular education;Fluidtherapy;Ultrasound;Electrical Stimulation;Moist Heat;Iontophoresis;Contrast Bath;Passive range of motion;Therapeutic activities;Patient/family education    Plan  d/c OT     Clinical Decision Making  Limited treatment options, no task modification necessary    Consulted and Agree with Plan of Care  Patient       Patient will benefit from skilled therapeutic intervention in order to improve the following deficits and impairments:  Pain, Decreased range of motion, Impaired UE functional use  Visit Diagnosis: Pain in right finger(s)    Problem List Patient Active Problem List   Diagnosis Date Noted  . Encounter for health maintenance examination with abnormal findings 07/12/2018  . 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 Tobacco use 07/12/2018    OCCUPATIONAL THERAPY DISCHARGE SUMMARY  Visits from Start of Care: 2  Current functional level related to goals / functional outcomes: See above   Remaining deficits: Pain with impact activities and heavy lifting with fingers.   Education / Equipment: Pt instructed in protocol for injury, splint and buddy strap wear/care and ROM.  Pt verbalized understanding of all education provided.  Plan: Patient agrees to discharge.  Patient goals were not met. Patient is being discharged due to lack of progress.  No improvement in pain. ?????       [facility] 09/04/2018, 3:24 PM  [facility] [address] [place], [place], [address] Phone: [phone]   Fax:  [phone]  Name: Clinton Pope MRN: [id number] Date of Birth: Dec 16, 1970   Vianne Bulls, OTR/L [facility] [address] [place], [place]  [address] [phone] phone [phone] 09/04/18 3:24 PM

## 2018-09-18 DIAGNOSIS — M79644 Pain in right finger(s): Secondary | ICD-10-CM | POA: Diagnosis not present

## 2018-10-04 DIAGNOSIS — M79644 Pain in right finger(s): Secondary | ICD-10-CM | POA: Diagnosis not present

## 2018-10-16 DIAGNOSIS — M79644 Pain in right finger(s): Secondary | ICD-10-CM | POA: Diagnosis not present

## 2018-11-13 DIAGNOSIS — M79644 Pain in right finger(s): Secondary | ICD-10-CM | POA: Diagnosis not present

## 2018-12-11 DIAGNOSIS — M79642 Pain in left hand: Secondary | ICD-10-CM | POA: Diagnosis not present

## 2018-12-11 DIAGNOSIS — M79641 Pain in right hand: Secondary | ICD-10-CM | POA: Diagnosis not present

## 2019-11-17 IMAGING — DX DG FINGER MIDDLE 2+V*R*
3 series · 3 of 3 positions shown · non-contrast
Comparison: None.

CLINICAL DATA: Third digit pain, no known injury, initial encounter

EXAM:
RIGHT MIDDLE FINGER 2+V

[finger pa]
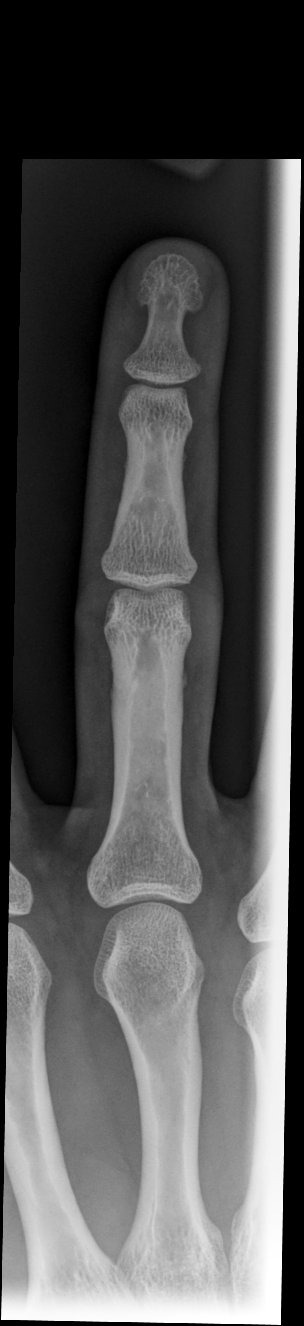

[finger mlo]
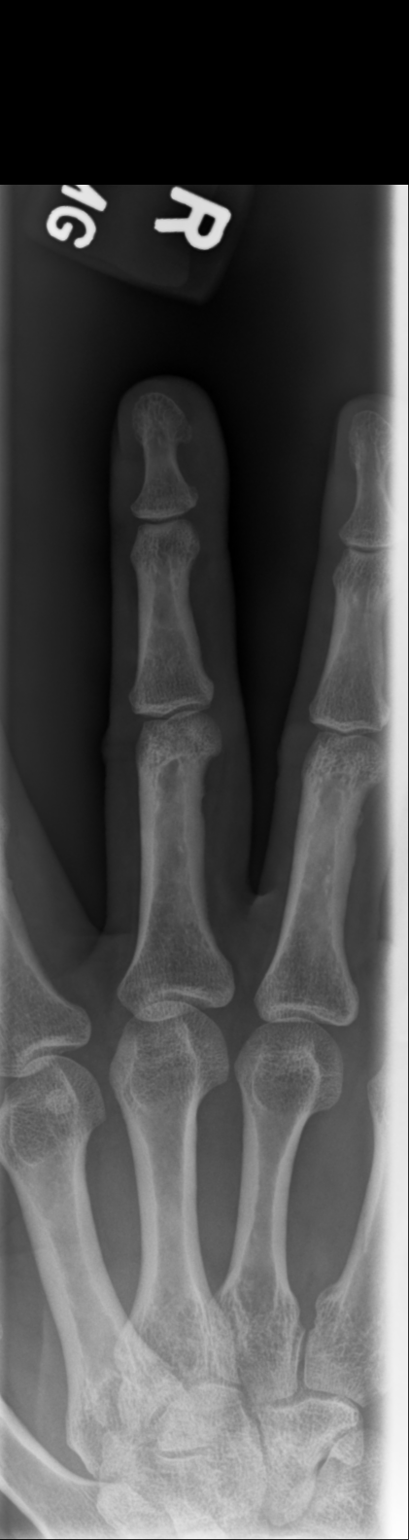

[finger lat]
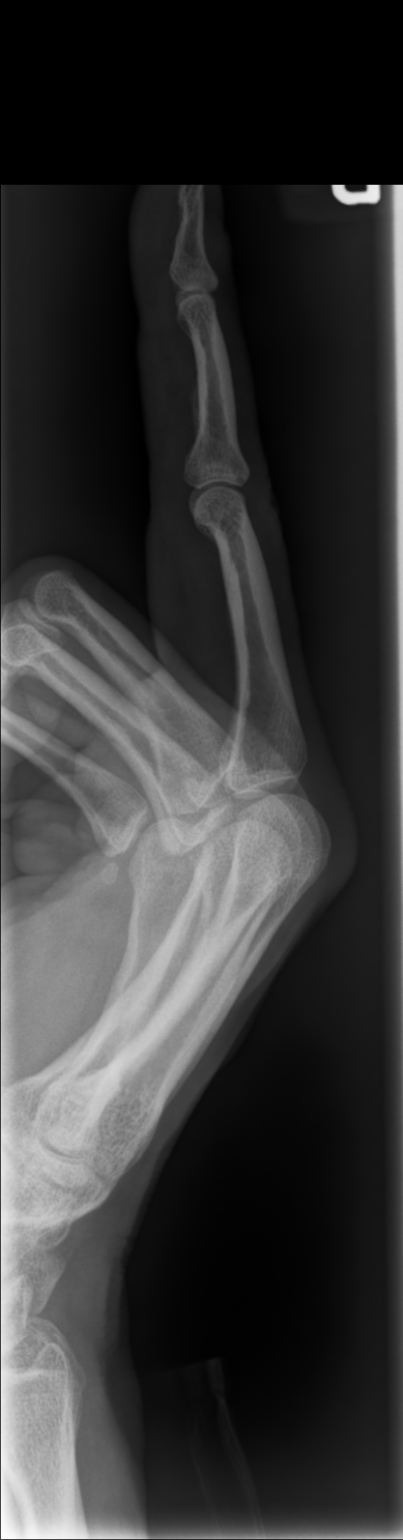

[3 of 3 positions shown; findings below may reference images not displayed]

FINDINGS: There is no evidence of fracture or dislocation. There is no
evidence of arthropathy or other focal bone abnormality. Soft
tissues are unremarkable.
IMPRESSION: No acute abnormality noted.

## 2020-01-21 ENCOUNTER — Other Ambulatory Visit: Payer: Self-pay

## 2020-01-22 ENCOUNTER — Encounter: Payer: Self-pay | Admitting: Family Medicine

## 2020-01-22 ENCOUNTER — Ambulatory Visit: Payer: BC Managed Care – PPO | Admitting: Family Medicine

## 2020-01-22 ENCOUNTER — Ambulatory Visit (INDEPENDENT_AMBULATORY_CARE_PROVIDER_SITE_OTHER): Payer: BC Managed Care – PPO

## 2020-01-22 VITALS — BP 119/80 | HR 90 | Temp 98.4°F | Ht 73.0 in | Wt 176.0 lb

## 2020-01-22 DIAGNOSIS — M25512 Pain in left shoulder: Secondary | ICD-10-CM | POA: Insufficient documentation

## 2020-01-22 MED ORDER — MELOXICAM 15 MG PO TABS: 15.0000 mg | ORAL_TABLET | Freq: Every day | ORAL | 0 refills | Status: DC

## 2020-01-22 NOTE — Progress Notes (Signed)
Established Patient Office Visit  Subjective:  Patient ID: Clinton Pope, male    DOB: 06-17-1971  Age: 49 y.o. MRN: [id number]  CC:  Chief Complaint  Patient presents with  . Shoulder Pain    Pt c/o lt shoulder discomfort/soreness, x 80month, on and off.  Shoulder feels like he has been working out,  pt explains that the discomfort feeling moves around in different area of the shoulder.    HPI Clinton Pope presents for evaluation and treatment of left shoulder discomfort over the last several months.  There has been no specific injury.  He is an avid golfer played 2-3 times weekly.  He is right-hand dominant and plays golf right-handed as well.  He does yoga as well but remains at level 1.  Tells of decreased range of motion with internal rotation.  Seems to do better with motor activity.  Sometimes feels sore at night when he rolls over on it.  History reviewed. No pertinent past medical history.  History reviewed. No pertinent surgical history.  Family History  Problem Relation Age of Onset  . Cancer Mother   . Early death Mother   . Early death Father     Social History   Socioeconomic History  . Marital status: Married    Spouse name: Not on file  . Number of children: Not on file  . Years of education: Not on file  . Highest education level: Not on file  Occupational History  . Not on file  Tobacco Use  . Smoking status: Current Every Day Smoker    Packs/day: 1.00    Years: 30.00    Pack years: 30.00  . Smokeless tobacco: Never Used  Substance and Sexual Activity  . Alcohol use: Yes    Comment: RARELY  . Drug use: Never  . Sexual activity: Yes    Partners: Female  Other Topics Concern  . Not on file  Social History Narrative  . Not on file   Social Determinants of Health   Financial Resource Strain:   . Difficulty of Paying Living Expenses:   Food Insecurity:   . Worried About RCharity fundraiserin the Last Year:   . RArboriculturistin the  Last Year:   Transportation Needs:   . LFilm/video editor(Medical):   .Marland KitchenLack of Transportation (Non-Medical):   Physical Activity:   . Days of Exercise per Week:   . Minutes of Exercise per Session:   Stress:   . Feeling of Stress :   Social Connections:   . Frequency of Communication with Friends and Family:   . Frequency of Social Gatherings with Friends and Family:   . Attends Religious Services:   . Active Member of Clubs or Organizations:   . Attends CArchivistMeetings:   .Marland KitchenMarital Status:   Intimate Partner Violence:   . Fear of Current or Ex-Partner:   . Emotionally Abused:   .Marland KitchenPhysically Abused:   . Sexually Abused:     Outpatient Medications Prior to Visit  Medication Sig Dispense Refill  . diclofenac sodium (VOLTAREN) 1 % GEL Apply a small amount to tender spot on right middle finger 4 times daily as needed. 100 g 1  . naproxen sodium (ALEVE) 220 MG tablet Take 220 mg by mouth.    . carbamide peroxide (EAR WAX REMOVAL KIT) 6.5 % OTIC solution Place 5 drops into both ears 2 (two) times daily. (Patient not taking: Reported on 01/22/2020)  15 mL 0   No facility-administered medications prior to visit.    No Known Allergies  ROS Review of Systems  Constitutional: Negative.   Respiratory: Negative.   Cardiovascular: Negative.   Gastrointestinal: Negative.   Musculoskeletal: Positive for arthralgias. Negative for joint swelling.  Psychiatric/Behavioral: Negative.       Objective:    Physical Exam  Constitutional: He is oriented to person, place, and time. He appears well-developed and well-nourished. No distress.  HENT:  Head: Normocephalic and atraumatic.  Right Ear: External ear normal.  Left Ear: External ear normal.  Eyes: Conjunctivae are normal. Right eye exhibits no discharge. Left eye exhibits no discharge. No scleral icterus.  Pulmonary/Chest: Effort normal.  Musculoskeletal:     Left shoulder: No swelling, deformity, tenderness, bony  tenderness, pain or spasms. Decreased range of motion.       Arms:  Neurological: He is alert and oriented to person, place, and time.  Skin: Skin is dry. He is not diaphoretic.  Psychiatric: He has a normal mood and affect. His behavior is normal.    BP 119/80 (BP Location: Left Arm, Patient Position: Sitting, Cuff Size: Normal)   Pulse 90   Temp 98.4 F (36.9 C) (Temporal)   Ht 6' 1"  (1.854 m)   Wt 176 lb (79.8 kg)   SpO2 96%   BMI 23.22 kg/m  Wt Readings from Last 3 Encounters:  01/22/20 176 lb (79.8 kg)  07/12/18 172 lb 2 oz (78.1 kg)  06/26/18 174 lb (78.9 kg)     Health Maintenance Due  Topic Date Due  . COVID-19 Vaccine (1) Never done  . TETANUS/TDAP  Never done  . COLONOSCOPY  Never done    There are no preventive care reminders to display for this patient.  Lab Results  Component Value Date   TSH 1.51 07/12/2018   Lab Results  Component Value Date   WBC 8.1 07/12/2018   HGB 16.6 07/12/2018   HCT 47.6 07/12/2018   MCV 89.3 07/12/2018   PLT 279.0 07/12/2018   Lab Results  Component Value Date   NA 142 07/12/2018   K 4.3 07/12/2018   CO2 26 07/12/2018   GLUCOSE 95 07/12/2018   BUN 12 07/12/2018   CREATININE 0.91 07/12/2018   BILITOT 0.5 07/12/2018   ALKPHOS 63 07/12/2018   AST 18 07/12/2018   ALT 25 07/12/2018   PROT 6.9 07/12/2018   ALBUMIN 4.5 07/12/2018   CALCIUM 9.4 07/12/2018   GFR 94.11 07/12/2018   Lab Results  Component Value Date   CHOL 220 (H) 07/12/2018   Lab Results  Component Value Date   HDL 34.00 (L) 07/12/2018   No results found for: [facility] Lab Results  Component Value Date   TRIG 209.0 (H) 07/12/2018   Lab Results  Component Value Date   CHOLHDL 6 07/12/2018   No results found for: HGBA1C    Assessment & Plan:   Problem List Items Addressed This Visit      Other   Left shoulder pain - Primary   Relevant Medications   meloxicam (MOBIC) 15 MG tablet   Other Relevant Orders   DG Shoulder Left      Meds  ordered this encounter  Medications  . meloxicam (MOBIC) 15 MG tablet    Sig: Take 1 tablet (15 mg total) by mouth daily. For 2 weeks and then as needed.    Dispense:  30 tablet    Refill:  0    Follow-up: Return Sports  med referral if not improving with exercises and mobic.Marland Kitchen  Anticipating normal films today.  Mobic for 15 days.  We will try the given exercises.  Libby Maw, MD

## 2020-01-22 NOTE — Addendum Note (Signed)
Addended by: Lynnea Ferrier on: 01/22/2020 03:48 PM   Modules accepted: Orders

## 2020-01-22 NOTE — Patient Instructions (Signed)
Shoulder Exercises Ask your health care provider which exercises are safe for you. Do exercises exactly as told by your health care provider and adjust them as directed. It is normal to feel mild stretching, pulling, tightness, or discomfort as you do these exercises. Stop right away if you feel sudden pain or your pain gets worse. Do not begin these exercises until told by your health care provider. Stretching exercises External rotation and abduction This exercise is sometimes called corner stretch. This exercise rotates your arm outward (external rotation) and moves your arm out from your body (abduction). 1. Stand in a doorway with one of your feet slightly in front of the other. This is called a staggered stance. If you cannot reach your forearms to the door frame, stand facing a corner of a room. 2. Choose one of the following positions as told by your health care provider: ? Place your hands and forearms on the door frame above your head. ? Place your hands and forearms on the door frame at the height of your head. ? Place your hands on the door frame at the height of your elbows. 3. Slowly move your weight onto your front foot until you feel a stretch across your chest and in the front of your shoulders. Keep your head and chest upright and keep your abdominal muscles tight. 4. Hold for __________ seconds. 5. To release the stretch, shift your weight to your back foot. Repeat __________ times. Complete this exercise __________ times a day. Extension, standing 1. Stand and hold a broomstick, a cane, or a similar object behind your back. ? Your hands should be a little wider than shoulder width apart. ? Your palms should face away from your back. 2. Keeping your elbows straight and your shoulder muscles relaxed, move the stick away from your body until you feel a stretch in your shoulders (extension). ? Avoid shrugging your shoulders while you move the stick. Keep your shoulder blades tucked  down toward the middle of your back. 3. Hold for __________ seconds. 4. Slowly return to the starting position. Repeat __________ times. Complete this exercise __________ times a day. Range-of-motion exercises Pendulum  1. Stand near a wall or a surface that you can hold onto for balance. 2. Bend at the waist and let your left / right arm hang straight down. Use your other arm to support you. Keep your back straight and do not lock your knees. 3. Relax your left / right arm and shoulder muscles, and move your hips and your trunk so your left / right arm swings freely. Your arm should swing because of the motion of your body, not because you are using your arm or shoulder muscles. 4. Keep moving your hips and trunk so your arm swings in the following directions, as told by your health care provider: ? Side to side. ? Forward and backward. ? In clockwise and counterclockwise circles. 5. Continue each motion for __________ seconds, or for as long as told by your health care provider. 6. Slowly return to the starting position. Repeat __________ times. Complete this exercise __________ times a day. Shoulder flexion, standing  1. Stand and hold a broomstick, a cane, or a similar object. Place your hands a little more than shoulder width apart on the object. Your left / right hand should be palm up, and your other hand should be palm down. 2. Keep your elbow straight and your shoulder muscles relaxed. Push the stick up with your healthy arm to   raise your left / right arm in front of your body, and then over your head until you feel a stretch in your shoulder (flexion). ? Avoid shrugging your shoulder while you raise your arm. Keep your shoulder blade tucked down toward the middle of your back. 3. Hold for __________ seconds. 4. Slowly return to the starting position. Repeat __________ times. Complete this exercise __________ times a day. Shoulder abduction, standing 1. Stand and hold a broomstick,  a cane, or a similar object. Place your hands a little more than shoulder width apart on the object. Your left / right hand should be palm up, and your other hand should be palm down. 2. Keep your elbow straight and your shoulder muscles relaxed. Push the object across your body toward your left / right side. Raise your left / right arm to the side of your body (abduction) until you feel a stretch in your shoulder. ? Do not raise your arm above shoulder height unless your health care provider tells you to do that. ? If directed, raise your arm over your head. ? Avoid shrugging your shoulder while you raise your arm. Keep your shoulder blade tucked down toward the middle of your back. 3. Hold for __________ seconds. 4. Slowly return to the starting position. Repeat __________ times. Complete this exercise __________ times a day. Internal rotation  1. Place your left / right hand behind your back, palm up. 2. Use your other hand to dangle an exercise band, a towel, or a similar object over your shoulder. Grasp the band with your left / right hand so you are holding on to both ends. 3. Gently pull up on the band until you feel a stretch in the front of your left / right shoulder. The movement of your arm toward the center of your body is called internal rotation. ? Avoid shrugging your shoulder while you raise your arm. Keep your shoulder blade tucked down toward the middle of your back. 4. Hold for __________ seconds. 5. Release the stretch by letting go of the band and lowering your hands. Repeat __________ times. Complete this exercise __________ times a day. Strengthening exercises External rotation  1. Sit in a stable chair without armrests. 2. Secure an exercise band to a stable object at elbow height on your left / right side. 3. Place a soft object, such as a folded towel or a small pillow, between your left / right upper arm and your body to move your elbow about 4 inches (10 cm) away  from your side. 4. Hold the end of the exercise band so it is tight and there is no slack. 5. Keeping your elbow pressed against the soft object, slowly move your forearm out, away from your abdomen (external rotation). Keep your body steady so only your forearm moves. 6. Hold for __________ seconds. 7. Slowly return to the starting position. Repeat __________ times. Complete this exercise __________ times a day. Shoulder abduction  1. Sit in a stable chair without armrests, or stand up. 2. Hold a __________ weight in your left / right hand, or hold an exercise band with both hands. 3. Start with your arms straight down and your left / right palm facing in, toward your body. 4. Slowly lift your left / right hand out to your side (abduction). Do not lift your hand above shoulder height unless your health care provider tells you that this is safe. ? Keep your arms straight. ? Avoid shrugging your shoulder while you   do this movement. Keep your shoulder blade tucked down toward the middle of your back. 5. Hold for __________ seconds. 6. Slowly lower your arm, and return to the starting position. Repeat __________ times. Complete this exercise __________ times a day. Shoulder extension 1. Sit in a stable chair without armrests, or stand up. 2. Secure an exercise band to a stable object in front of you so it is at shoulder height. 3. Hold one end of the exercise band in each hand. Your palms should face each other. 4. Straighten your elbows and lift your hands up to shoulder height. 5. Step back, away from the secured end of the exercise band, until the band is tight and there is no slack. 6. Squeeze your shoulder blades together as you pull your hands down to the sides of your thighs (extension). Stop when your hands are straight down by your sides. Do not let your hands go behind your body. 7. Hold for __________ seconds. 8. Slowly return to the starting position. Repeat __________ times.  Complete this exercise __________ times a day. Shoulder row 1. Sit in a stable chair without armrests, or stand up. 2. Secure an exercise band to a stable object in front of you so it is at waist height. 3. Hold one end of the exercise band in each hand. Position your palms so that your thumbs are facing the ceiling (neutral position). 4. Bend each of your elbows to a 90-degree angle (right angle) and keep your upper arms at your sides. 5. Step back until the band is tight and there is no slack. 6. Slowly pull your elbows back behind you. 7. Hold for __________ seconds. 8. Slowly return to the starting position. Repeat __________ times. Complete this exercise __________ times a day. Shoulder press-ups  1. Sit in a stable chair that has armrests. Sit upright, with your feet flat on the floor. 2. Put your hands on the armrests so your elbows are bent and your fingers are pointing forward. Your hands should be about even with the sides of your body. 3. Push down on the armrests and use your arms to lift yourself off the chair. Straighten your elbows and lift yourself up as much as you comfortably can. ? Move your shoulder blades down, and avoid letting your shoulders move up toward your ears. ? Keep your feet on the ground. As you get stronger, your feet should support less of your body weight as you lift yourself up. 4. Hold for __________ seconds. 5. Slowly lower yourself back into the chair. Repeat __________ times. Complete this exercise __________ times a day. Wall push-ups  1. Stand so you are facing a stable wall. Your feet should be about one arm-length away from the wall. 2. Lean forward and place your palms on the wall at shoulder height. 3. Keep your feet flat on the floor as you bend your elbows and lean forward toward the wall. 4. Hold for __________ seconds. 5. Straighten your elbows to push yourself back to the starting position. Repeat __________ times. Complete this exercise  __________ times a day. This information is not intended to replace advice given to you by your health care provider. Make sure you discuss any questions you have with your health care provider. Document Revised: 12/07/2018 Document Reviewed: 09/14/2018 Elsevier Patient Education  2020 [employer]  

## 2020-02-26 ENCOUNTER — Encounter: Payer: Self-pay | Admitting: Family Medicine

## 2020-02-26 DIAGNOSIS — M25512 Pain in left shoulder: Secondary | ICD-10-CM

## 2020-03-03 ENCOUNTER — Encounter: Payer: Self-pay | Admitting: Family Medicine

## 2020-03-03 ENCOUNTER — Other Ambulatory Visit: Payer: Self-pay

## 2020-03-03 ENCOUNTER — Ambulatory Visit: Payer: Self-pay

## 2020-03-03 ENCOUNTER — Ambulatory Visit: Payer: BC Managed Care – PPO | Admitting: Family Medicine

## 2020-03-03 VITALS — BP 130/92 | HR 90 | Ht 72.0 in | Wt 175.0 lb

## 2020-03-03 DIAGNOSIS — M7502 Adhesive capsulitis of left shoulder: Secondary | ICD-10-CM

## 2020-03-03 MED ORDER — TRIAMCINOLONE ACETONIDE 40 MG/ML IJ SUSP
40.0000 mg | Freq: Once | INTRAMUSCULAR | Status: AC
  Administered 2020-03-03: 40 mg via INTRA_ARTICULAR

## 2020-03-03 NOTE — Assessment & Plan Note (Signed)
Acute on chronic in nature.  Has limitations in his range of motion to suggest more of a frozen shoulder. -Counseled on home exercise therapy and supportive care. Injection. -Could consider physical therapy

## 2020-03-03 NOTE — Patient Instructions (Signed)
Nice to meet you Please try ice  Please try the exercises   Please send me a message in MyChart with any questions or updates.  Please see me back in 4 weeks.   --Dr. Zani Kyllonen  

## 2020-03-03 NOTE — Progress Notes (Signed)
Clinton Pope - 49 y.o. male MRN [id number]  Date of birth: Nov 08, 1970  SUBJECTIVE:  Including CC & ROS.  Chief Complaint  Patient presents with  . Shoulder Pain    left shoulder    Clinton Pope is a 49 y.o. male that is presenting with acute on chronic left shoulder pain.  Denies any specific inciting event.  Is able to do most things but has limitations with some range of motion.  No history of surgery.  It is isolated to the shoulder.  Has limited improvement with meloxicam and therapies to date.  No radicular symptoms.  Independent review of the left shoulder x-ray from 5/26 shows no acute abnormalities.   Review of Systems See HPI   HISTORY: Past Medical, Surgical, Social, and Family History Reviewed & Updated per EMR.   Pertinent Historical Findings include:  No past medical history on file.  No past surgical history on file.  Family History  Problem Relation Age of Onset  . Cancer Mother   . Early death Mother   . Early death Father     Social History   Socioeconomic History  . Marital status: Married    Spouse name: Not on file  . Number of children: Not on file  . Years of education: Not on file  . Highest education level: Not on file  Occupational History  . Not on file  Tobacco Use  . Smoking status: Current Every Day Smoker    Packs/day: 1.00    Years: 30.00    Pack years: 30.00  . Smokeless tobacco: Never Used  Substance and Sexual Activity  . Alcohol use: Yes    Comment: RARELY  . Drug use: Never  . Sexual activity: Yes    Partners: Female  Other Topics Concern  . Not on file  Social History Narrative  . Not on file   Social Determinants of Health   Financial Resource Strain:   . Difficulty of Paying Living Expenses:   Food Insecurity:   . Worried About Charity fundraiser in the Last Year:   . Arboriculturist in the Last Year:   Transportation Needs:   . Film/video editor (Medical):   Marland Kitchen Lack of Transportation (Non-Medical):    Physical Activity:   . Days of Exercise per Week:   . Minutes of Exercise per Session:   Stress:   . Feeling of Stress :   Social Connections:   . Frequency of Communication with Friends and Family:   . Frequency of Social Gatherings with Friends and Family:   . Attends Religious Services:   . Active Member of Clubs or Organizations:   . Attends Archivist Meetings:   Marland Kitchen Marital Status:   Intimate Partner Violence:   . Fear of Current or Ex-Partner:   . Emotionally Abused:   Marland Kitchen Physically Abused:   . Sexually Abused:      PHYSICAL EXAM:  VS: BP (!) 130/92   Pulse 90   Ht 6' (1.829 m)   Wt 175 lb (79.4 kg)   BMI 23.73 kg/m  Physical Exam Gen: NAD, alert, cooperative with exam, well-appearing MSK:  Left shoulder: Limited flexion.  Mild limitation of external rotation. Normal empty can testing. Normal speeds test. Normal O'Brien's test. Neurovascular intact   Aspiration/Injection Procedure Note Darey Stall [birth year]  Procedure: Injection Indications: Left shoulder pain  Procedure Details Consent: Risks of procedure as well as the alternatives and risks of each were explained to the (  patient/caregiver).  Consent for procedure obtained. Time Out: Verified patient identification, verified procedure, site/side was marked, verified correct patient position, special equipment/implants available, medications/allergies/relevent history reviewed, required imaging and test results available.  Performed.  The area was cleaned with iodine and alcohol swabs.    The left glenohumeral joint was injected using 1 cc's of 40 mg Kenalog, 3 cc of 1% lidocaine and 3 cc's of 0.25% bupivacaine with a 22 3 1/2" needle.  Ultrasound was used. Images were obtained in short views showing the injection.     A sterile dressing was applied.  Patient did tolerate procedure well.     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  Has  limitations in his range of motion to suggest more of a frozen shoulder. -Counseled on home exercise therapy and supportive care. Injection. -Could consider physical therapy

## 2020-03-31 ENCOUNTER — Other Ambulatory Visit: Payer: Self-pay

## 2020-03-31 ENCOUNTER — Encounter: Payer: Self-pay | Admitting: Family Medicine

## 2020-03-31 ENCOUNTER — Ambulatory Visit: Payer: BC Managed Care – PPO | Admitting: Family Medicine

## 2020-03-31 DIAGNOSIS — M7502 Adhesive capsulitis of left shoulder: Secondary | ICD-10-CM

## 2020-03-31 NOTE — Progress Notes (Signed)
  Clinton Pope - 49 y.o. male MRN [id number]  Date of birth: Feb 09, 1971  SUBJECTIVE:  Including CC & ROS.  Chief Complaint  Patient presents with  . Follow-up    left shoulder    Clinton Pope is a 49 y.o. male that is following up for his left shoulder pain.  He received a glenohumeral injection and now has complete resolution of his limitation of pain.   Review of Systems See HPI   HISTORY: Past Medical, Surgical, Social, and Family History Reviewed & Updated per EMR.   Pertinent Historical Findings include:  No past medical history on file.  No past surgical history on file.  Family History  Problem Relation Age of Onset  . Cancer Mother   . Early death Mother   . Early death Father     Social History   Socioeconomic History  . Marital status: Married    Spouse name: Not on file  . Number of children: Not on file  . Years of education: Not on file  . Highest education level: Not on file  Occupational History  . Not on file  Tobacco Use  . Smoking status: Current Every Day Smoker    Packs/day: 1.00    Years: 30.00    Pack years: 30.00  . Smokeless tobacco: Never Used  Substance and Sexual Activity  . Alcohol use: Yes    Comment: RARELY  . Drug use: Never  . Sexual activity: Yes    Partners: Female  Other Topics Concern  . Not on file  Social History Narrative  . Not on file   Social Determinants of Health   Financial Resource Strain:   . Difficulty of Paying Living Expenses:   Food Insecurity:   . Worried About Charity fundraiser in the Last Year:   . Arboriculturist in the Last Year:   Transportation Needs:   . Film/video editor (Medical):   Marland Kitchen Lack of Transportation (Non-Medical):   Physical Activity:   . Days of Exercise per Week:   . Minutes of Exercise per Session:   Stress:   . Feeling of Stress :   Social Connections:   . Frequency of Communication with Friends and Family:   . Frequency of Social Gatherings with Friends and  Family:   . Attends Religious Services:   . Active Member of Clubs or Organizations:   . Attends Archivist Meetings:   Marland Kitchen Marital Status:   Intimate Partner Violence:   . Fear of Current or Ex-Partner:   . Emotionally Abused:   Marland Kitchen Physically Abused:   . Sexually Abused:      PHYSICAL EXAM:  VS: BP (!) 144/96   Pulse 94   Ht 6' (1.829 m)   Wt 170 lb (77.1 kg)   BMI 23.06 kg/m  Physical Exam Gen: NAD, alert, cooperative with exam, well-appearing    ASSESSMENT & PLAN:   Adhesive capsulitis of left shoulder No limitation in range of motion and little pain today.  Feels back to normal. -Counseled on home exercise therapy and supportive care. -Follow-up as needed.

## 2020-03-31 NOTE — Assessment & Plan Note (Signed)
No limitation in range of motion and little pain today.  Feels back to normal. -Counseled on home exercise therapy and supportive care. -Follow-up as needed.

## 2020-06-22 ENCOUNTER — Encounter: Payer: Self-pay | Admitting: Nurse Practitioner

## 2020-06-22 ENCOUNTER — Ambulatory Visit: Payer: BC Managed Care – PPO | Admitting: Nurse Practitioner

## 2020-06-22 ENCOUNTER — Other Ambulatory Visit: Payer: Self-pay | Admitting: Nurse Practitioner

## 2020-06-22 ENCOUNTER — Other Ambulatory Visit: Payer: Self-pay

## 2020-06-22 VITALS — BP 122/110 | HR 84 | Temp 97.9°F | Ht 72.0 in | Wt 173.8 lb

## 2020-06-22 DIAGNOSIS — R222 Localized swelling, mass and lump, trunk: Secondary | ICD-10-CM | POA: Diagnosis not present

## 2020-06-22 DIAGNOSIS — M19072 Primary osteoarthritis, left ankle and foot: Secondary | ICD-10-CM

## 2020-06-22 NOTE — Patient Instructions (Addendum)
You will be contacted to schedule appt with podiatry Use aleve for toe pain.

## 2020-06-22 NOTE — Progress Notes (Signed)
Subjective:  Patient ID: Clinton Pope, male    DOB: February 03, 1971  Age: 49 y.o. MRN: [id number]  CC: Acute Visit (Pt c/o pulled muscle x2 weeks across his left breast. Pt states it would wake him at night but states today he has been feeling fine. Pt also has pain in right foot below the pinky toe. )  Toe Pain  The incident occurred 5 to 7 days ago. There was no injury mechanism. The pain is present in the right toes. The quality of the pain is described as aching. The pain has been fluctuating since onset. Pertinent negatives include no inability to bear weight, numbness or tingling. The symptoms are aggravated by movement, palpation and weight bearing. He has tried rest for the symptoms.   Clinton Pope also presents with left chest wall swelling and tenderness x 2weeks. Denies any chest wall injury. No pleuritic pain.  Reviewed past Medical, Social and Family history today.  Outpatient Medications Prior to Visit  Medication Sig Dispense Refill  . naproxen sodium (ALEVE) 220 MG tablet Take 220 mg by mouth.     . carbamide peroxide (EAR WAX REMOVAL KIT) 6.5 % OTIC solution Place 5 drops into both ears 2 (two) times daily. (Patient not taking: Reported on 01/22/2020) 15 mL 0  . diclofenac sodium (VOLTAREN) 1 % GEL Apply a small amount to tender spot on right middle finger 4 times daily as needed. (Patient not taking: Reported on 06/22/2020) 100 g 1  . meloxicam (MOBIC) 15 MG tablet Take 1 tablet (15 mg total) by mouth daily. For 2 weeks and then as needed. (Patient not taking: Reported on 06/22/2020) 30 tablet 0   No facility-administered medications prior to visit.    ROS See HPI  Objective:  BP (!) 122/110 (BP Location: Right Arm, Cuff Size: Normal)   Pulse 84   Temp 97.9 F (36.6 C) (Temporal)   Ht 6' (1.829 m)   Wt 173 lb 12.8 oz (78.8 kg)   SpO2 98%   BMI 23.57 kg/m   Physical Exam Vitals reviewed.  Cardiovascular:     Pulses:          Dorsalis pedis pulses are 2+ on  the right side and 2+ on the left side.       Posterior tibial pulses are 2+ on the right side and 2+ on the left side.  Pulmonary:     Effort: Pulmonary effort is normal.  Chest:     Breasts: Breasts are asymmetrical.        Right: Normal.        Left: Swelling, mass and tenderness present. No inverted nipple, nipple discharge or skin change.    Musculoskeletal:     Cervical back: Normal range of motion and neck supple.       Feet:  Feet:     Right foot:     Toenail Condition: Right toenails are normal.     Left foot:     Toenail Condition: Left toenails are normal.  Lymphadenopathy:     Cervical: No cervical adenopathy.     Upper Body:     Right upper body: No supraclavicular, axillary or pectoral adenopathy.     Left upper body: No supraclavicular, axillary or pectoral adenopathy.  Neurological:     Mental Status: He is alert.    Assessment & Plan:  This visit occurred during the SARS-CoV-2 public health emergency.  Safety protocols were in place, including screening questions prior to the visit, additional usage  of staff PPE, and extensive cleaning of exam room while observing appropriate contact time as indicated for disinfecting solutions.   Clinton Pope was seen today for acute visit.  Diagnoses and all orders for this visit:  Mass of chest wall, left -     [place] CHEST SOFT TISSUE; Future  Arthritis of first metatarsophalangeal (MTP) joint of left foot -     Ambulatory referral to Podiatry   Problem List Items Addressed This Visit    None    Visit Diagnoses    Mass of chest wall, left    -  Primary   Relevant Orders   [place] CHEST SOFT TISSUE   Arthritis of first metatarsophalangeal (MTP) joint of left foot       Relevant Orders   Ambulatory referral to Podiatry      Follow-up: No follow-ups on file.  Wilfred Lacy, NP

## 2020-06-23 ENCOUNTER — Telehealth: Payer: Self-pay | Admitting: Family Medicine

## 2020-06-23 NOTE — Telephone Encounter (Signed)
Good morning, I have a question about The stat [place] order you put in for Clinton Pope MRN: [id number], The soonest I can get him in to the breast center of [place] for the mammogram and [place] is 07/16/20 and they can put him on a wait list as well. If we send him to [facility] in [place] they can see him 07/07/20 for both. How do you wish for [place] to proceed with this? I sent in secure chat as well but I wasn't sure how long it stays in there

## 2020-07-03 ENCOUNTER — Ambulatory Visit (INDEPENDENT_AMBULATORY_CARE_PROVIDER_SITE_OTHER): Payer: BC Managed Care – PPO

## 2020-07-03 ENCOUNTER — Encounter: Payer: Self-pay | Admitting: Podiatry

## 2020-07-03 ENCOUNTER — Other Ambulatory Visit: Payer: Self-pay

## 2020-07-03 ENCOUNTER — Ambulatory Visit: Payer: BC Managed Care – PPO | Admitting: Podiatry

## 2020-07-03 DIAGNOSIS — S9032XA Contusion of left foot, initial encounter: Secondary | ICD-10-CM

## 2020-07-03 DIAGNOSIS — M7671 Peroneal tendinitis, right leg: Secondary | ICD-10-CM | POA: Diagnosis not present

## 2020-07-04 NOTE — Progress Notes (Signed)
Subjective:   Patient ID: Clinton Pope, male   DOB: 49 y.o.   MRN: [id number]   HPI Patient is presenting with discomfort in the outside of the right foot that he states is been present for around 6 months.  States that it is worse with activity and has never really gotten awful but it is a nuisance for him and he like to get it better if possible.  Patient smokes pack per day and likes to be active   Review of Systems  All other systems reviewed and are negative.       Objective:  Physical Exam Vitals and nursing note reviewed.  Constitutional:      Appearance: He is well-developed.  Pulmonary:     Effort: Pulmonary effort is normal.  Musculoskeletal:        General: Normal range of motion.  Skin:    General: Skin is warm.  Neurological:     Mental Status: He is alert.     Neurovascular status intact muscle strength adequate range of motion within normal limits.  Patient is found to have discomfort in the outside of the right foot proximal to the fifth metatarsal head with localized inflammation and no indications of joint issues at the time.  Good range of motion of all joints is noted and good digital perfusion     Assessment:  Probability for some type of inflammatory condition of the lateral side right foot extending into the fifth MPJ     Plan:  H&P x-rays reviewed and today careful steroid injection administered 3 mg dexamethasone 5 mg Xylocaine after proper sterile technique.  I then discussed ice therapy topical therapy and reappoint as needed and may require other treatment if symptoms persist  X-rays are negative for signs of fracture or arthritis

## 2020-07-16 ENCOUNTER — Other Ambulatory Visit: Payer: Self-pay

## 2020-07-16 ENCOUNTER — Ambulatory Visit: Payer: BC Managed Care – PPO

## 2020-07-16 ENCOUNTER — Ambulatory Visit
Admission: RE | Admit: 2020-07-16 | Discharge: 2020-07-16 | Disposition: A | Discharge status: Home / Self Care | Payer: BC Managed Care – PPO | Source: Ambulatory Visit | Attending: Nurse Practitioner | Admitting: Nurse Practitioner

## 2020-07-16 DIAGNOSIS — N62 Hypertrophy of breast: Secondary | ICD-10-CM | POA: Diagnosis not present

## 2020-07-16 DIAGNOSIS — R222 Localized swelling, mass and lump, trunk: Secondary | ICD-10-CM

## 2020-07-17 ENCOUNTER — Encounter: Payer: Self-pay | Admitting: Nurse Practitioner

## 2020-08-09 ENCOUNTER — Other Ambulatory Visit: Payer: Self-pay

## 2020-08-09 ENCOUNTER — Emergency Department (HOSPITAL_BASED_OUTPATIENT_CLINIC_OR_DEPARTMENT_OTHER)
Admission: EM | Admit: 2020-08-09 | Discharge: 2020-08-09 | Disposition: A | Discharge status: Home / Self Care | Payer: BC Managed Care – PPO | Attending: Emergency Medicine | Admitting: Emergency Medicine

## 2020-08-09 ENCOUNTER — Encounter (HOSPITAL_BASED_OUTPATIENT_CLINIC_OR_DEPARTMENT_OTHER): Payer: Self-pay | Admitting: Emergency Medicine

## 2020-08-09 DIAGNOSIS — F17200 Nicotine dependence, unspecified, uncomplicated: Secondary | ICD-10-CM | POA: Insufficient documentation

## 2020-08-09 DIAGNOSIS — R1013 Epigastric pain: Secondary | ICD-10-CM | POA: Diagnosis not present

## 2020-08-09 DIAGNOSIS — R030 Elevated blood-pressure reading, without diagnosis of hypertension: Secondary | ICD-10-CM | POA: Diagnosis not present

## 2020-08-09 DIAGNOSIS — R1011 Right upper quadrant pain: Secondary | ICD-10-CM | POA: Insufficient documentation

## 2020-08-09 DIAGNOSIS — R109 Unspecified abdominal pain: Secondary | ICD-10-CM

## 2020-08-09 LAB — CBC WITH DIFFERENTIAL/PLATELET
Abs Immature Granulocytes: 0.03 10*3/uL (ref 0.00–0.07)
Basophils Absolute: 0.1 10*3/uL (ref 0.0–0.1)
Basophils Relative: 1 %
Eosinophils Absolute: 0.2 10*3/uL (ref 0.0–0.5)
Eosinophils Relative: 2 %
HCT: 47.9 % (ref 39.0–52.0)
Hemoglobin: 16.5 g/dL (ref 13.0–17.0)
Immature Granulocytes: 0 %
Lymphocytes Relative: 20 %
Lymphs Abs: 2.3 10*3/uL (ref 0.7–4.0)
MCH: 30.4 pg (ref 26.0–34.0)
MCHC: 34.4 g/dL (ref 30.0–36.0)
MCV: 88.2 fL (ref 80.0–100.0)
Monocytes Absolute: 0.8 10*3/uL (ref 0.1–1.0)
Monocytes Relative: 7 %
Neutro Abs: 8 10*3/uL — ABNORMAL HIGH (ref 1.7–7.7)
Neutrophils Relative %: 70 %
Platelets: 287 10*3/uL (ref 150–400)
RBC: 5.43 MIL/uL (ref 4.22–5.81)
RDW: 12.7 % (ref 11.5–15.5)
WBC: 11.4 10*3/uL — ABNORMAL HIGH (ref 4.0–10.5)
nRBC: 0 % (ref 0.0–0.2)

## 2020-08-09 LAB — COMPREHENSIVE METABOLIC PANEL
ALT: 15 U/L (ref 0–44)
AST: 13 U/L — ABNORMAL LOW (ref 15–41)
Albumin: 4 g/dL (ref 3.5–5.0)
Alkaline Phosphatase: 63 U/L (ref 38–126)
Anion gap: 9 (ref 5–15)
BUN: 22 mg/dL — ABNORMAL HIGH (ref 6–20)
CO2: 22 mmol/L (ref 22–32)
Calcium: 8.8 mg/dL — ABNORMAL LOW (ref 8.9–10.3)
Chloride: 106 mmol/L (ref 98–111)
Creatinine, Ser: 0.84 mg/dL (ref 0.61–1.24)
GFR, Estimated: >60 mL/min (ref >60)
Glucose, Bld: 116 mg/dL — ABNORMAL HIGH (ref 70–99)
Potassium: 4.2 mmol/L (ref 3.5–5.1)
Sodium: 137 mmol/L (ref 135–145)
Total Bilirubin: 0.4 mg/dL (ref 0.3–1.2)
Total Protein: 7.3 g/dL (ref 6.5–8.1)

## 2020-08-09 LAB — LIPASE, BLOOD: Lipase: 38 U/L (ref 11–51)

## 2020-08-09 NOTE — ED Provider Notes (Signed)
[facility] Provider Note   CSN: [id number] Arrival date & time: 08/09/20  2426     History Chief Complaint  Patient presents with  . Abdominal Pain    Clinton Pope is a 49 y.o. male.  The history is provided by the patient.  Abdominal Pain Pain location:  RUQ and epigastric Pain quality: aching   Pain radiates to:  Does not radiate Pain severity:  Mild Onset quality:  Gradual Progression:  Resolved Chronicity:  New Context: eating (maybe?)   Relieved by:  Nothing Worsened by:  Nothing Associated symptoms: no anorexia, no belching, no chest pain, no chills, no constipation, no cough, no diarrhea, no dysuria, no fatigue, no fever, no flatus, no hematemesis, no hematochezia, no hematuria, no melena, no nausea, no shortness of breath, no sore throat, no vaginal bleeding, no vaginal discharge and no vomiting   Risk factors: has not had multiple surgeries        History reviewed. No pertinent past medical history.  Patient Active Problem List   Diagnosis Date Noted  . 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 Left shoulder pain 01/22/2020  . Finger pain, right 09/18/2018  . Encounter for health maintenance examination with abnormal findings 07/12/2018  . 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 Tobacco use 07/12/2018    Past Surgical History:  Procedure Laterality Date  . testicular torsion         Family History  Problem Relation Age of Onset  . Cancer Mother   . Early death Mother   . Breast cancer Mother   . Early death Father     Social History   Tobacco Use  . Smoking status: Current Every Day Smoker    Packs/day: 1.00    Years: 30.00    Pack years: 30.00  . Smokeless tobacco: Never Used  Substance Use Topics  . Alcohol use: Yes    Comment: RARELY  . Drug use: Never    Home Medications Prior to Admission medications   Medication Sig Start Date End Date Taking? Authorizing Provider  carbamide  peroxide (EAR WAX REMOVAL KIT) 6.5 % OTIC solution Place 5 drops into both ears 2 (two) times daily. Patient not taking: Reported on 01/22/2020 07/12/18   Libby Maw, MD  diclofenac sodium (VOLTAREN) 1 % GEL Apply a small amount to tender spot on right middle finger 4 times daily as needed. Patient not taking: Reported on 06/22/2020 06/26/18   Libby Maw, MD  meloxicam (MOBIC) 15 MG tablet Take 1 tablet (15 mg total) by mouth daily. For 2 weeks and then as needed. Patient not taking: Reported on 06/22/2020 01/22/20   Libby Maw, MD  naproxen sodium (ALEVE) 220 MG tablet Take 220 mg by mouth.     [provider]    Allergies    Patient has no known allergies.  Review of Systems   Review of Systems  Constitutional: Negative for chills, fatigue and fever.  HENT: Negative for ear pain and sore throat.   Eyes: Negative for pain and visual disturbance.  Respiratory: Negative for cough and shortness of breath.   Cardiovascular: Negative for chest pain and palpitations.  Gastrointestinal: Positive for abdominal pain. Negative for anorexia, constipation, diarrhea, flatus, hematemesis, hematochezia, melena, nausea and vomiting.  Genitourinary: Negative for dysuria, hematuria, vaginal bleeding and vaginal discharge.  Musculoskeletal: Negative for arthralgias and back pain.  Skin: Negative for color change and rash.  Neurological: Negative for seizures and syncope.  All other  systems reviewed and are negative.   Physical Exam Updated Vital Signs BP (!) 145/98 (BP Location: Right Arm)   Pulse 82   Temp 97.6 F (36.4 C) (Oral)   Resp 19   Ht 6' (1.829 m)   Wt 79.4 kg   SpO2 97%   BMI 23.73 kg/m   Physical Exam Vitals and nursing note reviewed.  Constitutional:      Appearance: He is well-developed and well-nourished.  HENT:     Head: Normocephalic and atraumatic.  Eyes:     Extraocular Movements: Extraocular movements intact.      Conjunctiva/sclera: Conjunctivae normal.  Cardiovascular:     Rate and Rhythm: Normal rate and regular rhythm.     Heart sounds: Normal heart sounds. No murmur heard.   Pulmonary:     Effort: Pulmonary effort is normal. No respiratory distress.     Breath sounds: Normal breath sounds.  Abdominal:     General: Abdomen is flat. There is no distension.     Palpations: Abdomen is soft.     Tenderness: There is no abdominal tenderness. There is no right CVA tenderness, left CVA tenderness, guarding or rebound. Negative signs include Murphy's sign, Rovsing's sign and McBurney's sign.  Musculoskeletal:        General: No edema.     Cervical back: Neck supple.  Skin:    General: Skin is warm and dry.     Capillary Refill: Capillary refill takes less than 2 seconds.  Neurological:     General: No focal deficit present.     Mental Status: He is alert.  Psychiatric:        Mood and Affect: Mood and affect normal.     ED Results / Procedures / Treatments   Labs (all labs ordered are listed, but only abnormal results are displayed) Labs Reviewed  CBC WITH DIFFERENTIAL/PLATELET - Abnormal; Notable for the following components:      Result Value   WBC 11.4 (*)    Neutro Abs 8.0 (*)    All other components within normal limits  COMPREHENSIVE METABOLIC PANEL - Abnormal; Notable for the following components:   Glucose, Bld 116 (*)    BUN 22 (*)    Calcium 8.8 (*)    AST 13 (*)    All other components within normal limits  LIPASE, BLOOD    EKG EKG Interpretation  Date/Time:  Sunday August 09 2020 03:15:27 EST Ventricular Rate:  93 PR Interval:    QRS Duration: 99 QT Interval:  351 QTC Calculation: 437 R Axis:   -18 Text Interpretation: Sinus rhythm Borderline left axis deviation RSR' in V1 or V2, probably normal variant Confirmed by Lennice Sites [phone]) on 08/09/2020 3:33:29 AM   Radiology No results found.  Procedures Procedures (including critical care  time)  Medications Ordered in ED Medications - No data to display  ED Course  I have reviewed the triage vital signs and the nursing notes.  Pertinent labs & imaging results that were available during my care of the patient were reviewed by me and considered in my medical decision making (see chart for details).    MDM Rules/Calculators/A&P                          Clinton Pope is a 49 year old male who presents to the ED with abdominal pain.  Normal vitals.  No fever.  No significant medical history.  No history of abdominal surgery.  Having mostly  epigastric and right upper quadrant abdominal pain. Several hours ago but has now greatly improved.  Has no real tenderness on exam.  No urinary symptoms.  No nausea, no vomiting, no constipation.  No concern for bowel obstruction.  Lab work showed no significant anemia, electrolyte ab Mody, kidney injury.  Liver enzymes were normal.  Gallbladder enzymes normal doubt acute cholecystitis.  Lipase is normal doubt pancreatitis.  Overall suspect may be mild gastritis versus biliary colic.  Recommend follow-up with primary care doctor and may need a right upper quadrant ultrasound.  Understands return precautions and discharged in the ED in good condition.  This chart was dictated using voice recognition software.  Despite best efforts to proofread,  errors can occur which can change the documentation meaning.     Final Clinical Impression(s) / ED Diagnoses Final diagnoses:  Abdominal pain, unspecified abdominal location    Rx / DC Orders ED Discharge Orders    None       Lennice Sites, DO 08/09/20 0423

## 2020-08-09 NOTE — ED Notes (Signed)
Unable to void at this time.  Used the bathroom in the waiting room.  Aware that we need a specimen when able to go.

## 2020-08-09 NOTE — ED Triage Notes (Signed)
Reports waking up with RUQ pain.  Denies any n/v/d.  Also endorses pain in back reports that's is because he can't stand up straight due to the abdominal pain.

## 2020-08-09 NOTE — Discharge Instructions (Addendum)
Please follow-up with your primary care doctor for right upper quadrant ultrasound.  Please return to the ED if symptoms worsen as discussed.

## 2020-09-11 ENCOUNTER — Encounter: Payer: Self-pay | Admitting: Podiatry

## 2020-09-21 ENCOUNTER — Other Ambulatory Visit: Payer: Self-pay | Admitting: Podiatry

## 2020-09-21 ENCOUNTER — Ambulatory Visit: Payer: BC Managed Care – PPO | Admitting: Podiatry

## 2020-09-21 ENCOUNTER — Ambulatory Visit (INDEPENDENT_AMBULATORY_CARE_PROVIDER_SITE_OTHER): Payer: BC Managed Care – PPO

## 2020-09-21 ENCOUNTER — Other Ambulatory Visit: Payer: Self-pay

## 2020-09-21 DIAGNOSIS — M21621 Bunionette of right foot: Secondary | ICD-10-CM | POA: Diagnosis not present

## 2020-09-21 DIAGNOSIS — M779 Enthesopathy, unspecified: Secondary | ICD-10-CM | POA: Diagnosis not present

## 2020-09-21 DIAGNOSIS — S9032XA Contusion of left foot, initial encounter: Secondary | ICD-10-CM

## 2020-09-21 DIAGNOSIS — M7671 Peroneal tendinitis, right leg: Secondary | ICD-10-CM

## 2020-09-21 NOTE — Progress Notes (Signed)
Subjective:   Patient ID: Clinton Pope, male   DOB: 50 y.o.   MRN: [id number]   HPI Patient states he continues to experience discomfort in the same spot of his right foot and states that it is more difficult when he tries to play golf or do other things he likes to do   ROS      Objective:  Physical Exam  Neurovascular status intact with continued discomfort which is at this point mostly around the head of the fifth metatarsal right with inflammation noted and failure to respond so far conservatively     Assessment:  Probability for tailor's bunion with inflammatory changes around the fifth MPJ noted with failure to respond so far to conservative treatment     Plan:  H&P reviewed condition.  I do think that osteotomy of the fifth metatarsal may be necessary and I educated him on this and at this point we discussed a shifting type osteotomy procedure.  I did go ahead today and I allowed him to read consent form discussing what we would do when he is got a hold off and is going on a trip and when he returns from the trip we will see the response and decide what else we need to do.  I Apsley explained there is no long-term guarantees this will fix this problem and that total recovery period would take 3 to 6 months.  He signed consent form I did go ahead today and I did do a sterile injection of the fifth MPJ lateral capsule 3 mg Dexasone Kenalog 5 mg Xylocaine and applied air fracture walker to completely immobilize let it rest and if this improves then he may not need surgery but I would say in the long run there is a very good chance he will need surgery  X-rays indicate there is reactive area around the fifth metatarsal head right with no indication of fracture or bone

## 2021-06-09 ENCOUNTER — Ambulatory Visit (INDEPENDENT_AMBULATORY_CARE_PROVIDER_SITE_OTHER): Payer: BC Managed Care – PPO

## 2021-06-09 ENCOUNTER — Encounter: Payer: Self-pay | Admitting: Podiatry

## 2021-06-09 ENCOUNTER — Ambulatory Visit: Payer: BC Managed Care – PPO | Admitting: Podiatry

## 2021-06-09 ENCOUNTER — Other Ambulatory Visit: Payer: Self-pay

## 2021-06-09 DIAGNOSIS — M7662 Achilles tendinitis, left leg: Secondary | ICD-10-CM | POA: Diagnosis not present

## 2021-06-09 DIAGNOSIS — M722 Plantar fascial fibromatosis: Secondary | ICD-10-CM

## 2021-06-09 MED ORDER — TRIAMCINOLONE ACETONIDE 10 MG/ML IJ SUSP
10.0000 mg | Freq: Once | INTRAMUSCULAR | Status: AC
Start: 2021-06-09 — End: 2021-06-09
  Administered 2021-06-09: 10 mg

## 2021-06-09 NOTE — Progress Notes (Signed)
Subjective:   Patient ID: Clinton Pope, male   DOB: 50 y.o.   MRN: [id number]   HPI Patient is presenting with a lot of discomfort in the back of the left heel stating he does not remember specific injury and its been coming and going but been present for around 6 months.  States it does get sore and makes it hard to walk at times   ROS      Objective:  Physical Exam  Ocular status intact with patient found to have left posterior Achilles tendinitis medial side with no central or lateral involvement with no muscle strength loss     Assessment:  Achilles tendinitis left medial side     Plan:  H&P reviewed condition and recommended conservative treatment and did discuss risk of injection.  Patient wants this done and I did sterile prep injected the medial side Achilles keep it away central lateral 3 mg Dexasone Kenalog 5 mg Xylocaine advised on boot usage anti-inflammatories heel lift and stretching exercises.  Reappoint to recheck as needed  X-rays indicate no signs of calcification or bony pathology or spurring

## 2021-06-09 NOTE — Patient Instructions (Signed)

## 2021-09-09 ENCOUNTER — Ambulatory Visit: Payer: BC Managed Care – PPO | Admitting: Podiatry

## 2021-09-09 ENCOUNTER — Encounter: Payer: Self-pay | Admitting: Podiatry

## 2021-09-09 ENCOUNTER — Other Ambulatory Visit: Payer: Self-pay

## 2021-09-09 DIAGNOSIS — M722 Plantar fascial fibromatosis: Secondary | ICD-10-CM | POA: Diagnosis not present

## 2021-09-09 DIAGNOSIS — M7662 Achilles tendinitis, left leg: Secondary | ICD-10-CM

## 2021-09-09 MED ORDER — TRIAMCINOLONE ACETONIDE 10 MG/ML IJ SUSP
10.0000 mg | Freq: Once | INTRAMUSCULAR | Status: AC
  Administered 2021-09-09: 10 mg

## 2021-09-10 NOTE — Progress Notes (Signed)
Subjective:   Patient ID: Clinton Pope, male   DOB: 51 y.o.   MRN: [id number]   HPI Patient presents with chronic heel pain plantar aspect left and posterior left with the posterior doing somewhat better the plantar being bothersome and its 1 of these that just comes back and forth and has been an ongoing issue   ROS      Objective:  Physical Exam  Neurovascular status intact with inflammation of the plantar aspect of the left heel more in the posterior portion with Achilles tendinitis of a mild nature     Assessment:  Chronic acute plantar fasciitis left that comes and goes but is bothersome for him on an ongoing basis     Plan:  H&P discussed difficulty of a condition like this with a long-term element of it and I went ahead today did a sterile prep of the area and carefully injected from a more proximal portion 3 mg Kenalog 5 mg Xylocaine into the plantar fascia advised on stretching exercises and casted for functional orthotics that I will refill with quarter inch heel lift to try to take pressure off the heel in a more satisfactory fashion

## 2021-10-04 ENCOUNTER — Telehealth: Payer: Self-pay | Admitting: Podiatry

## 2021-10-04 NOTE — Telephone Encounter (Signed)
Orthotics in.. lvm for pt to call to schedule an appt to pick them up. °

## 2021-10-06 ENCOUNTER — Ambulatory Visit: Payer: BC Managed Care – PPO

## 2021-10-06 ENCOUNTER — Other Ambulatory Visit: Payer: Self-pay

## 2021-10-06 DIAGNOSIS — M779 Enthesopathy, unspecified: Secondary | ICD-10-CM

## 2021-10-06 DIAGNOSIS — M722 Plantar fascial fibromatosis: Secondary | ICD-10-CM

## 2021-10-06 DIAGNOSIS — M7671 Peroneal tendinitis, right leg: Secondary | ICD-10-CM

## 2021-10-06 DIAGNOSIS — M21621 Bunionette of right foot: Secondary | ICD-10-CM

## 2021-10-06 DIAGNOSIS — M7662 Achilles tendinitis, left leg: Secondary | ICD-10-CM

## 2021-10-06 NOTE — Progress Notes (Signed)
SITUATION: Reason for Visit: Fitting and Delivery of Custom Fabricated Foot Orthoses Patient Report: Patient reports comfort and is satisfied with device.  OBJECTIVE DATA: Patient History / Diagnosis:     ICD-10-CM   1. Plantar fasciitis of left foot  M72.2     2. Achilles tendinitis of left lower extremity  M76.62     3. Peroneal tendinitis of right lower extremity  M76.71     4. Capsulitis  M77.9     5. Tailor's bunion of right foot  M21.621       Provided Device:  Custom Functional Foot Orthotics     [facility]: [phone]  GOAL OF ORTHOSIS - Improve gait - Decrease energy expenditure - Improve Balance - Provide Triplanar stability of foot complex - Facilitate motion  ACTIONS PERFORMED Patient was fit with foot orthotics trimmed to shoe last. Patient tolerated fittign procedure.   Patient was provided with verbal and written instruction and demonstration regarding donning, doffing, wear, care, proper fit, function, purpose, cleaning, and use of the orthosis and in all related precautions and risks and benefits regarding the orthosis.  Patient was also provided with verbal instruction regarding how to report any failures or malfunctions of the orthosis and necessary follow up care. Patient was also instructed to contact our office regarding any change in status that may affect the function of the orthosis.  Patient demonstrated independence with proper donning, doffing, and fit and verbalized understanding of all instructions.  PLAN: Patient is to follow up in one week or as necessary (PRN). All questions were answered and concerns addressed. Plan of care was discussed with and agreed upon by the patient.

## 2021-11-12 ENCOUNTER — Ambulatory Visit: Payer: BC Managed Care – PPO | Admitting: Podiatry

## 2021-11-12 ENCOUNTER — Ambulatory Visit (INDEPENDENT_AMBULATORY_CARE_PROVIDER_SITE_OTHER): Payer: BC Managed Care – PPO

## 2021-11-12 ENCOUNTER — Other Ambulatory Visit: Payer: Self-pay

## 2021-11-12 ENCOUNTER — Encounter: Payer: Self-pay | Admitting: Podiatry

## 2021-11-12 DIAGNOSIS — M722 Plantar fascial fibromatosis: Secondary | ICD-10-CM

## 2021-11-12 DIAGNOSIS — M21621 Bunionette of right foot: Secondary | ICD-10-CM

## 2021-11-12 MED ORDER — TRIAMCINOLONE ACETONIDE 10 MG/ML IJ SUSP
20.0000 mg | Freq: Once | INTRAMUSCULAR | Status: AC
  Administered 2021-11-12: 20 mg

## 2021-11-12 MED ORDER — PREDNISONE 10 MG PO TABS: ORAL_TABLET | ORAL | 0 refills | Status: DC

## 2021-11-14 NOTE — Progress Notes (Signed)
Subjective:  ? ?Patient ID: Clinton Pope, male   DOB: 51 y.o.   MRN: [id number]  ? ?HPI ?Patient states that the doctor the left heel is doing excellent but he is having some pain underneath both heels but it is more proximal and it almost feels a little bit like burning pain.  States it is bothering him states he likes his new orthotics and they have been helpful but this has been a problem ? ? ?ROS ? ? ?   ?Objective:  ?Physical Exam  ?Neurovascular status intact with more proximal inflammation of the plantar fascia bilateral with the insertion seeming to be doing pretty well and the Achilles left doing well ? ?   ?Assessment:  ?Appears to be a more proximal type of inflammatory fasciitis that is not affecting the Achilles and generally more of proximal to the insertion of the plantar fascia ? ?   ?Plan:  ?Reviewed condition and at this point careful more proximal injections were administered 3 mg dexamethasone Kenalog 5 mg Xylocaine and this will be reevaluated as needed ? ?At this point the right x-ray does not show spurring or any indications of arthritis stress fracture ?   ? ? ?

## 2021-12-02 ENCOUNTER — Encounter: Payer: Self-pay | Admitting: Podiatry

## 2021-12-02 ENCOUNTER — Ambulatory Visit: Payer: BC Managed Care – PPO | Admitting: Podiatry

## 2021-12-02 DIAGNOSIS — M7662 Achilles tendinitis, left leg: Secondary | ICD-10-CM

## 2021-12-02 DIAGNOSIS — M722 Plantar fascial fibromatosis: Secondary | ICD-10-CM | POA: Diagnosis not present

## 2021-12-02 NOTE — Progress Notes (Signed)
Subjective:  ? ?Patient ID: Clinton Pope, male   DOB: 51 y.o.   MRN: [id number]  ? ?HPI ?Patient presents stating he had a lot of pain after the injections but then the pain reduced and now he is back to pretty close to normal ? ? ?ROS ? ? ?   ?Objective:  ?Physical Exam  ?Neurovascular status intact with discomfort in the plantar fascia bilateral which has improved Achilles tendinitis which has improved with patient in orthotics also currently at this time ? ?   ?Assessment:  ?Fasciitis-like symptoms which have been stubborn but appears to be improving ? ?   ?Plan:  ?H&P reviewed condition at great length.  We discussed fasciitis we discussed anti-inflammatories we discussed stretching exercises and different modalities to do and spent a great deal time going over this and at this point I am hopeful that this will be the ends of his symptoms but I have given him strict instructions of any reoccurrence were to occur to let us know immediately.  Patient is discharged all questions answered and will come in as needed ?   ? ? ?

## 2021-12-07 IMAGING — MG DIGITAL DIAGNOSTIC BILAT W/ TOMO W/ CAD
6 of 10 series · 6 of 30 positions shown · non-contrast
Comparison: None.

CLINICAL DATA: Male patient describes fullness and tenderness under
LEFT nipple. Patient states that the palpable lump has recent
decreased in size.

EXAM:
DIGITAL DIAGNOSTIC BILATERAL MAMMOGRAM WITH TOMO AND CAD

[L MLO synth-2D]
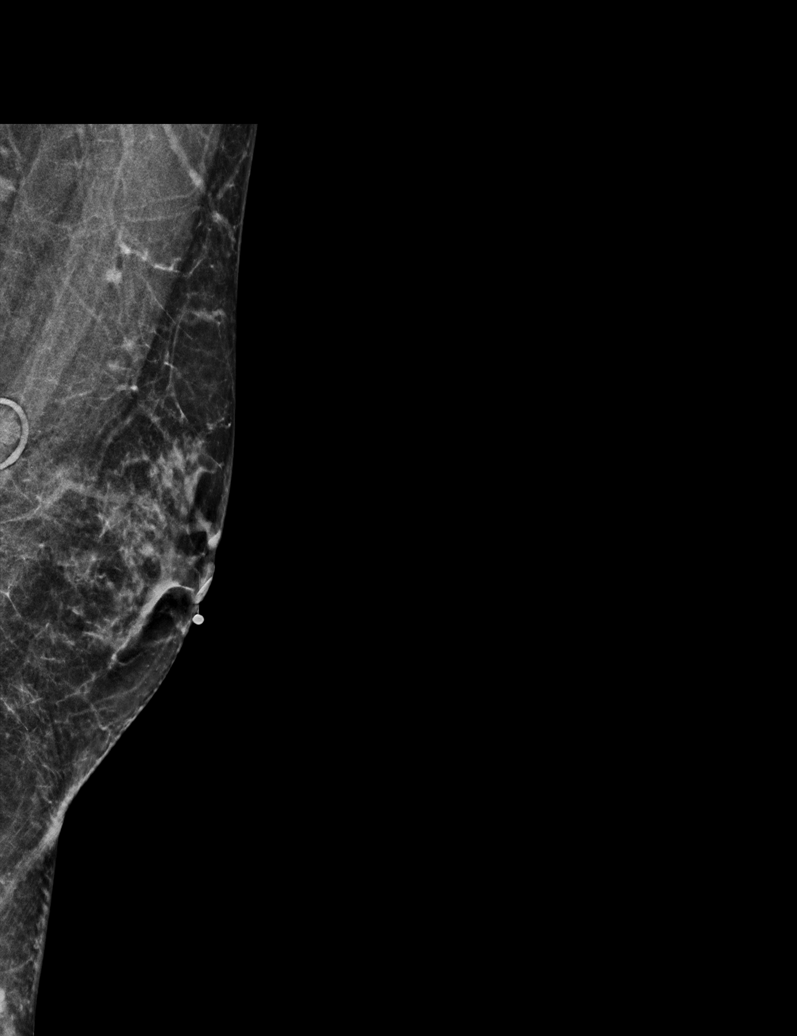

[R CC synth-2D]
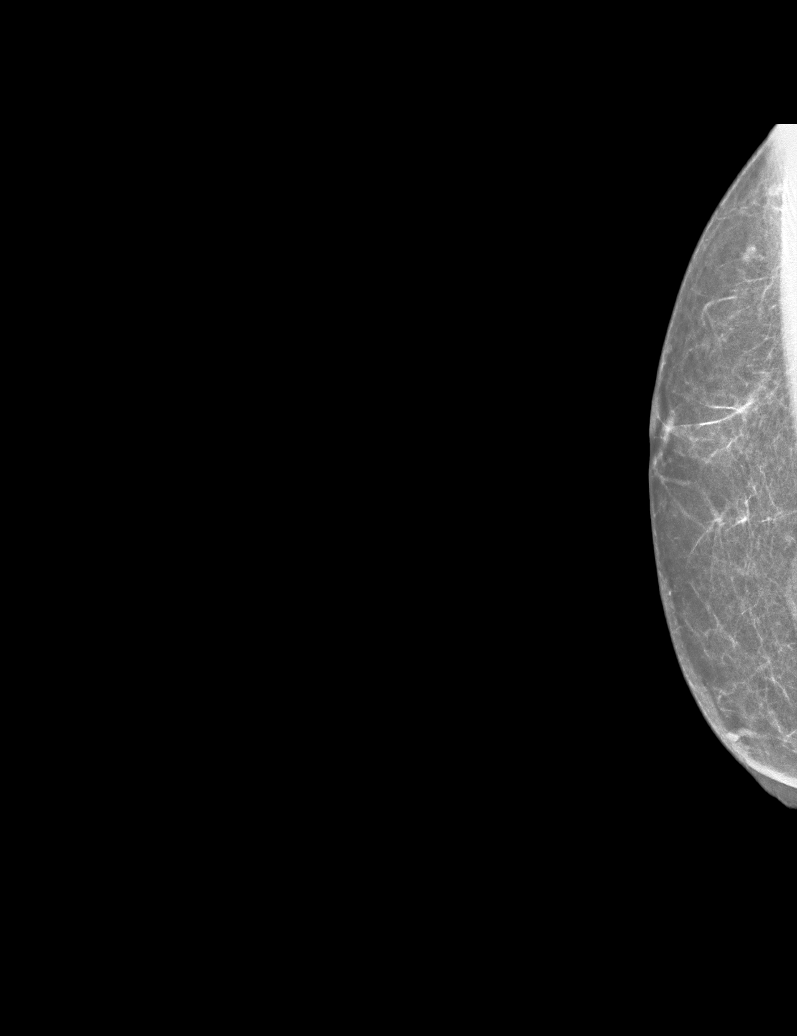

[L TAN synth-2D]
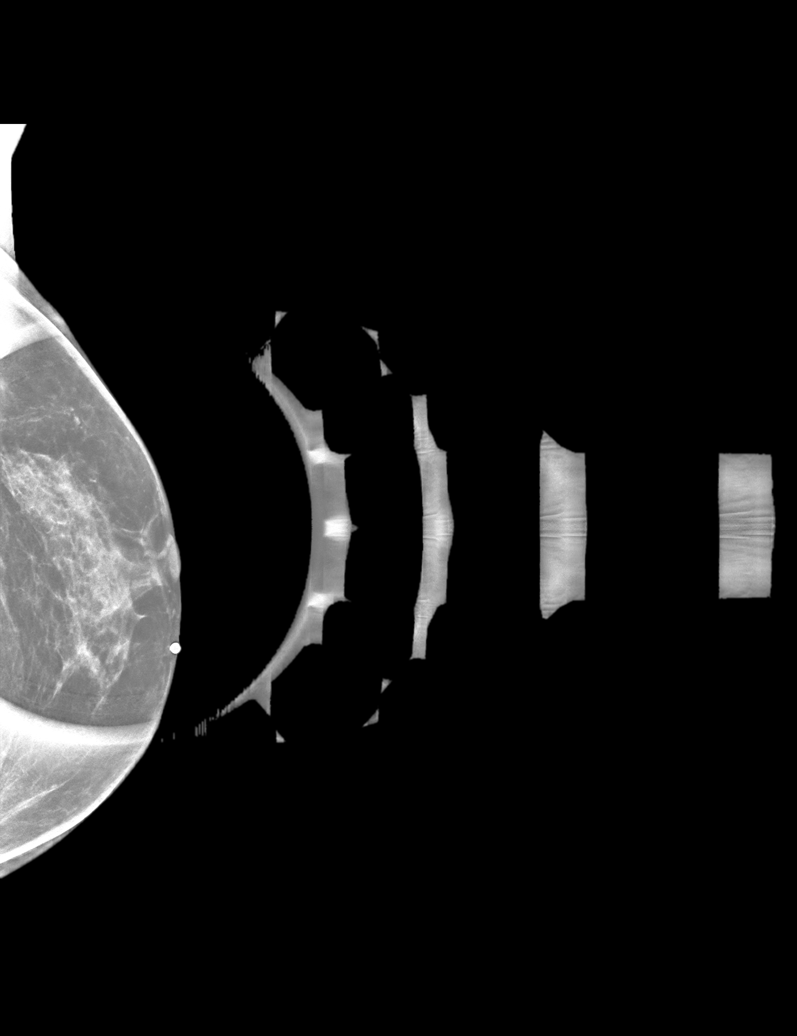

[L CC synth-2D]
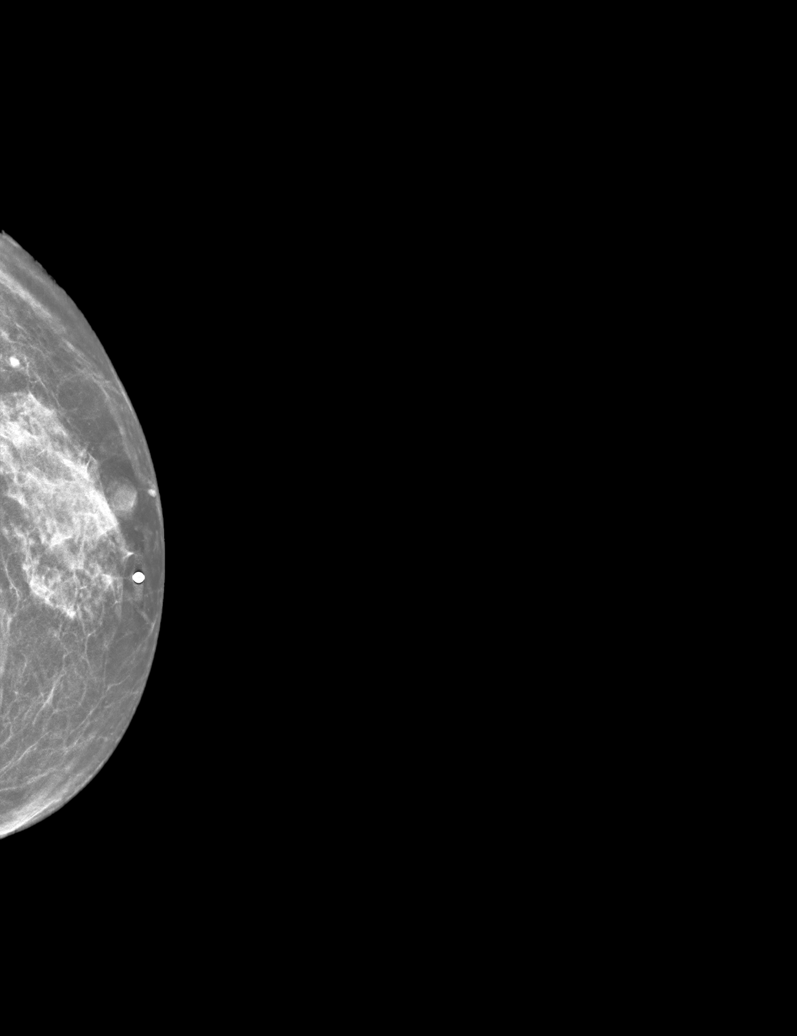

[R MLO synth-2D]
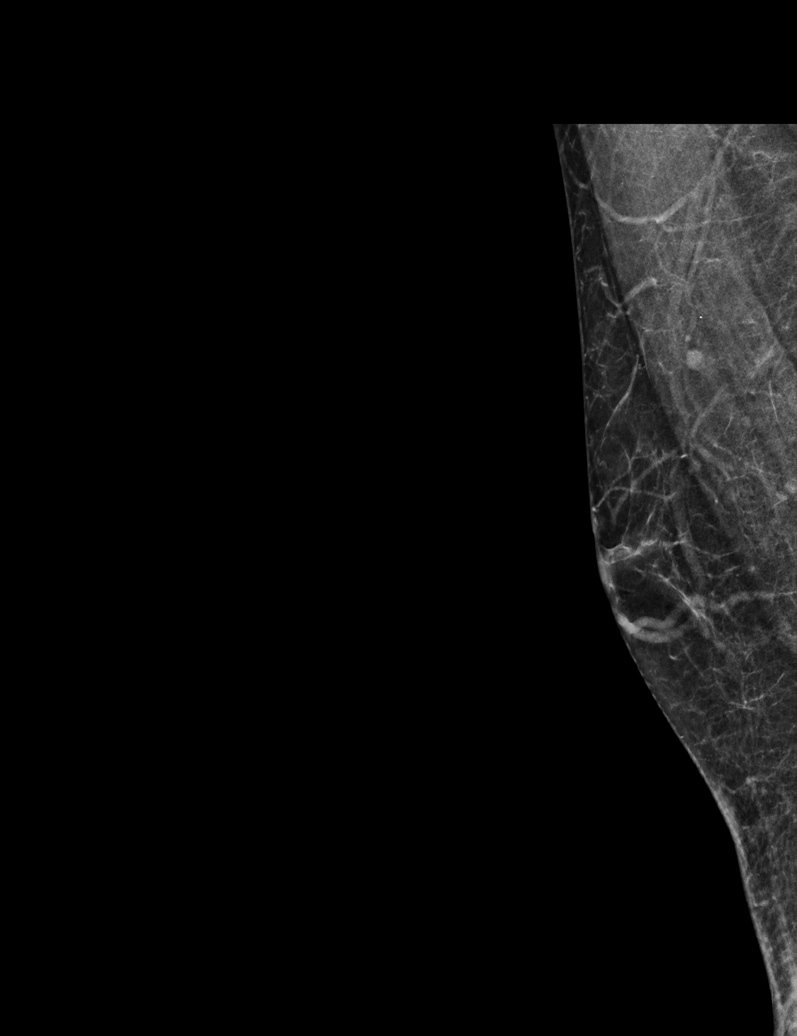

[R CC tomo · tomo slice 19/38.0]
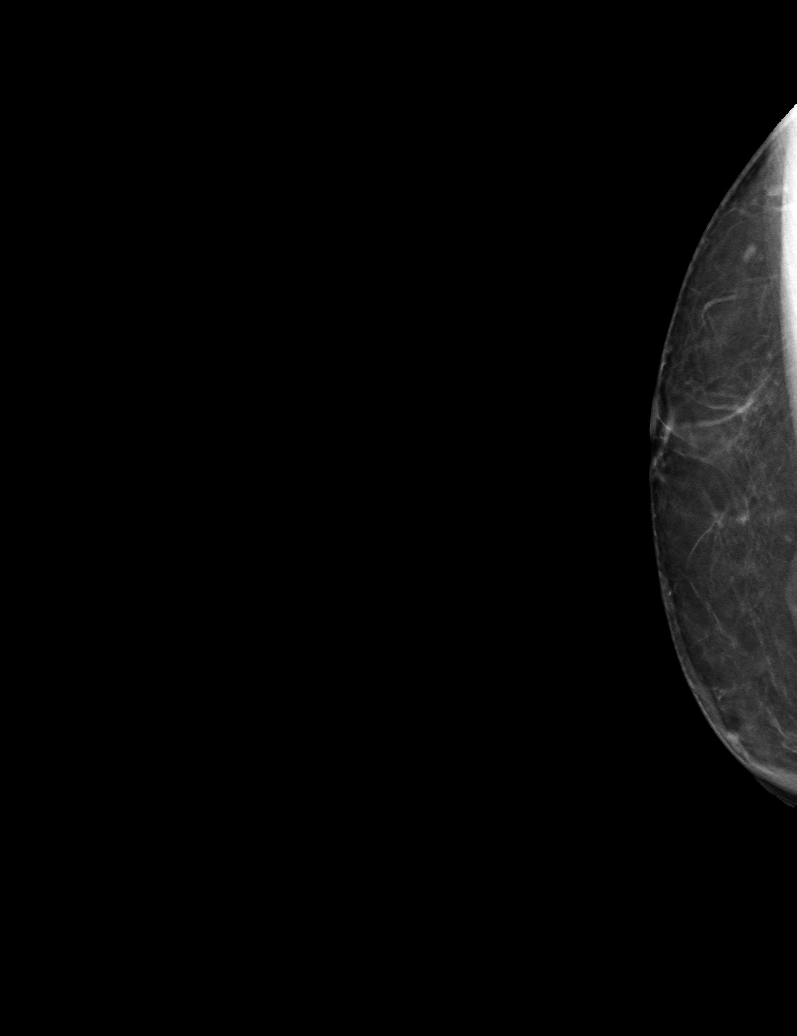

[6 of 30 positions shown; findings below may reference images not displayed]

ACR Breast Density Category c: The breast tissue is heterogeneously
dense, which may obscure small masses.
FINDINGS: There is unilateral florid gynecomastia of the LEFT breast.

There are no dominant masses, suspicious calcifications or secondary
signs of malignancy within either breast.

Mammographic images were processed with CAD.
IMPRESSION: 1. Unilateral LEFT-sided gynecomastia, florid type, corresponding to
the area of clinical concern.
2. No evidence of malignancy within either breast.

RECOMMENDATION:
1. Clinical follow-up for the LEFT-sided gynecomastia.
2. I discussed with the patient the fact that gynecomastia can occur
in older men as testosterone levels decrease with age or in younger
men with low testosterone levels, causing a change in the serum
testosterone:estrogen ratio. We also discussed other potential
etiologies of gynecomastia including numerous prescription
medications and chronic liver disease. Recreational drugs (marijuana
and anabolic steroids in particular) can also cause gynecomastia.
The patient was instructed to return for additional imaging if the
area that he feels becomes larger and/or firmer to palpation, or if
a new palpable abnormality is identified in either breast. We also
discussed the possibility of surgical excision if symptoms continue
and if an etiology of the gynecomastia cannot be determined and
therefore corrected.

I have discussed the findings and recommendations with the patient.
If applicable, a reminder letter will be sent to the patient
regarding the next appointment.

BI-RADS CATEGORY  2: Benign.

## 2022-07-22 DIAGNOSIS — R1902 Left upper quadrant abdominal swelling, mass and lump: Secondary | ICD-10-CM | POA: Diagnosis not present

## 2022-08-03 DIAGNOSIS — R1902 Left upper quadrant abdominal swelling, mass and lump: Secondary | ICD-10-CM | POA: Diagnosis not present

## 2022-09-05 ENCOUNTER — Ambulatory Visit: Payer: BC Managed Care – PPO | Admitting: Family Medicine

## 2022-09-05 ENCOUNTER — Encounter: Payer: Self-pay | Admitting: Family Medicine

## 2022-09-05 VITALS — BP 132/84 | HR 95 | Temp 97.8°F | Ht 72.0 in | Wt 181.8 lb

## 2022-09-05 DIAGNOSIS — D171 Benign lipomatous neoplasm of skin and subcutaneous tissue of trunk: Secondary | ICD-10-CM

## 2022-09-05 DIAGNOSIS — Z720 Tobacco use: Secondary | ICD-10-CM | POA: Diagnosis not present

## 2022-09-05 NOTE — Progress Notes (Signed)
   Established Patient Office Visit   Subjective:  Patient ID: Clinton Pope, male    DOB: [birth year]  Age: 52 y.o. MRN: [id number]  Chief Complaint  Patient presents with   Mass    Lump below left side ribs x 1 month patient ultrasound would like to discuss.     HPI Encounter Diagnoses  Name Primary?   Lipoma of torso Yes   Tobacco use    Returns to clinic with ultrasound report documenting what is believed to be a lipoma in his left upper abdominal area.  It is affecting his sleep he says.  When he rolls over on it bothers him.  Continues to smoke.  Is not ready to quit at this time.   Review of Systems  Constitutional: Negative.   HENT: Negative.    Eyes:  Negative for blurred vision, discharge and redness.  Respiratory: Negative.    Cardiovascular: Negative.   Gastrointestinal:  Negative for abdominal pain.  Genitourinary: Negative.   Musculoskeletal: Negative.  Negative for myalgias.  Skin:  Negative for rash.  Neurological:  Negative for tingling, loss of consciousness and weakness.  Endo/Heme/Allergies:  Negative for polydipsia.    No current outpatient medications on file.   Objective:     BP 132/84 (BP Location: Right Arm, Patient Position: Sitting, Cuff Size: Normal)   Pulse 95   Temp 97.8 F (36.6 C) (Temporal)   Ht 6' (1.829 m)   Wt 181 lb 12.8 oz (82.5 kg)   SpO2 100%   BMI 24.66 kg/m    Physical Exam Constitutional:      General: He is not in acute distress.    Appearance: Normal appearance. He is not ill-appearing, toxic-appearing or diaphoretic.  HENT:     Head: Normocephalic and atraumatic.     Right Ear: External ear normal.     Left Ear: External ear normal.  Eyes:     General: No scleral icterus.       Right eye: No discharge.        Left eye: No discharge.     Extraocular Movements: Extraocular movements intact.     Conjunctiva/sclera: Conjunctivae normal.  Pulmonary:     Effort: Pulmonary effort is normal. No respiratory  distress.  Skin:    General: Skin is warm and dry.       Neurological:     Mental Status: He is alert and oriented to person, place, and time.  Psychiatric:        Mood and Affect: Mood normal.        Behavior: Behavior normal.      No results found for any visits on 09/05/22.    The ASCVD Risk score (Arnett DK, et al., 2019) failed to calculate for the following reasons:   Cannot find a previous HDL lab   Cannot find a previous total cholesterol lab    Assessment & Plan:   Lipoma of torso -     Ambulatory referral to General Surgery  Tobacco use    Return return for physical exam fasting..  General surgeon referral for lipoma extraction.  Advised to quit smoking.  Information given on steps to take to do so.  Information was given on Chantix.  He is not motivated to quit at this time.  Libby Maw, MD

## 2022-09-22 ENCOUNTER — Ambulatory Visit: Payer: Self-pay | Admitting: Surgery

## 2022-09-22 ENCOUNTER — Encounter (HOSPITAL_COMMUNITY): Payer: Self-pay | Admitting: Surgery

## 2022-09-22 ENCOUNTER — Other Ambulatory Visit: Payer: Self-pay

## 2022-09-22 DIAGNOSIS — R222 Localized swelling, mass and lump, trunk: Secondary | ICD-10-CM | POA: Diagnosis not present

## 2022-09-22 NOTE — H&P (Signed)
Clinton Pope [id number]    Referring Provider:  Jon Billings, MD     Subjective    Chief Complaint: New Consultation (Lipoma of torso)       History of Present Illness:    Very pleasant 52 year old male referred for evaluation of a possible lipoma below his left-sided ribs.  He first noticed this around 5 months ago.  This causes him discomfort specifically when he rolls over in his sleep.  Ultrasound done at [facility], report viewable in care everywhere-describes an oval roughly 1.9 x 0.4 x 2.1cm subcutaneous nodule along the anterior abdominal wall just underneath the left rib line with echogenicity comparable to adjacent subcutaneous fat, no internal vascularity-probable subcutaneous lipoma. No significant change in the size or quality since he first noticed it.  He does note that occasionally it feels more firm than other times.     Review of Systems: A complete review of systems was obtained from the patient.  I have reviewed this information and discussed as appropriate with the patient.  See HPI as well for other ROS.     Medical History: Past Medical History  History reviewed. No pertinent past medical history.     There is no problem list on file for this patient.     Past Surgical History  History reviewed. No pertinent surgical history.      Allergies  No Known Allergies     No current outpatient medications on file prior to visit.    No current facility-administered medications on file prior to visit.      Family History       Family History  Problem Relation Age of Onset   Breast cancer Mother          Social History        Tobacco Use  Smoking Status Every Day   Packs/day: 1   Types: Cigarettes  Smokeless Tobacco Never      Social History  Social History         Socioeconomic History   Marital status: Married  Tobacco Use   Smoking status: Every Day      Packs/day: 1      Types: Cigarettes   Smokeless tobacco: Never  Substance  and Sexual Activity   Alcohol use: Yes   Drug use: Never        Objective:         Vitals:    09/22/22 1048  BP: (!) 145/95  Pulse: 101  Temp: 36.7 C (98 F)  SpO2: 98%  Weight: 83.5 kg (184 lb)  Height: 182.9 cm (6')    Body mass index is 24.95 kg/m.   Gen: A&Ox3, no distress  Unlabored respirations Abdomen soft, nontender, nondistended.  About 5 cm below the costal margin in the midclavicular line is a smooth, mobile, rubbery subcutaneous mass which is mildly tender, concordant with ultrasound exam suggesting lipoma.  No overlying skin changes.   Assessment and Plan:  Diagnoses and all orders for this visit:   Subcutaneous mass     Discussed that this is likely benign.  Given pain, patient desires excision.  Discussed the procedure and risk of bleeding, infection, pain, scarring, injury to underlying tissue or structures, failure to resolve pain, recurrence of the lesion, wound healing problems or undesired cosmetic result.  Questions welcomed and answered.  We will schedule at the patient's convenience-    Ashari Llewellyn Raquel James, MD

## 2022-09-22 NOTE — H&P (View-Only) (Signed)
Clinton Pope [id number]    Referring Provider:  Jon Billings, MD     Subjective    Chief Complaint: New Consultation (Lipoma of torso)       History of Present Illness:    Very pleasant 52 year old male referred for evaluation of a possible lipoma below his left-sided ribs.  He first noticed this around 5 months ago.  This causes him discomfort specifically when he rolls over in his sleep.  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 No significant change in the size or quality since he first noticed it.  He does note that occasionally it feels more firm than other times.     Review of Systems: A complete review of systems was obtained from the patient.  I have reviewed this information and discussed as appropriate with the patient.  See HPI as well for other ROS.     Medical History: Past Medical History  History reviewed. No pertinent past medical history.     There is no problem list on file for this patient.     Past Surgical History  History reviewed. No pertinent surgical history.      Allergies  No Known Allergies     No current outpatient medications on file prior to visit.    No current facility-administered medications on file prior to visit.      Family History       Family History  Problem Relation Age of Onset   Breast cancer Mother          Social History        Tobacco Use  Smoking Status Every Day   Packs/day: 1   Types: Cigarettes  Smokeless Tobacco Never      Social History  Social History         Socioeconomic History   Marital status: Married  Tobacco Use   Smoking status: Every Day      Packs/day: 1      Types: Cigarettes   Smokeless tobacco: Never  Substance  and Sexual Activity   Alcohol use: Yes   Drug use: Never        Objective:         Vitals:    09/22/22 1048  BP: (!) 145/95  Pulse: 101  Temp: 36.7 C (98 F)  SpO2: 98%  Weight: 83.5 kg (184 lb)  Height: 182.9 cm (6')    Body mass index is 24.95 kg/m.   Gen: A&Ox3, no distress  Unlabored respirations Abdomen soft, nontender, nondistended.  About 5 cm below the costal margin in the midclavicular line is a smooth, mobile, rubbery subcutaneous mass which is mildly tender, concordant with ultrasound exam suggesting lipoma.  No overlying skin changes.   Assessment and Plan:  Diagnoses and all orders for this visit:   Subcutaneous mass     Discussed that this is likely benign.  Given pain, patient desires excision.  Discussed the procedure and risk of bleeding, infection, pain, scarring, injury to underlying tissue or structures, failure to resolve pain, recurrence of the lesion, wound healing problems or undesired cosmetic result.  Questions welcomed and answered.  We will schedule at the patient's convenience-    Janice Seales Raquel James, MD

## 2022-09-22 NOTE — Progress Notes (Signed)
COVID Vaccine Completed:  Yes  Date of COVID positive in last 90 days:  No  PCP -  Abelino Derrick, MD Cardiologist -  N/A  Chest x-ray -  N/A EKG -  N/A Stress Test - 20+ years ago ECHO -  N/A Cardiac Cath -  N/A Pacemaker/ICD device last checked: Spinal Cord Stimulator: N/A  Bowel Prep -  N/A  Sleep Study -  N/A CPAP -   Fasting Blood Sugar -  N/A Checks Blood Sugar _____ times a day  Last dose of GLP1 agonist-  N/A GLP1 instructions:  N/A   Last dose of SGLT-2 inhibitors-  N/A SGLT-2 instructions: N/A  Blood Thinner Instructions: Aspirin Instructions: Last Dose:  Activity level:  Can go up a flight of stairs and perform activities of daily living without stopping and without symptoms of chest pain or shortness of breath.  Able to exercise without symptoms  Anesthesia review:  N/A  Patient denies shortness of breath, fever, cough and chest pain at PAT appointment (completed over the phone)  Patient verbalized understanding of instructions that were given to them at the PAT appointment. Patient was also instructed that they will need to review over the PAT instructions again at home before surgery.

## 2022-09-25 NOTE — Anesthesia Preprocedure Evaluation (Signed)
Anesthesia Evaluation  Patient identified by MRN, date of birth, ID band Patient awake    Reviewed: Allergy & Precautions, NPO status , Patient's Chart, lab work & pertinent test results  Airway Mallampati: II  TM Distance: >3 FB Neck ROM: Full    Dental no notable dental hx. (+) Teeth Intact, Dental Advisory Given   Pulmonary Current Smoker and Patient abstained from smoking.   Pulmonary exam normal breath sounds clear to auscultation       Cardiovascular negative cardio ROS Normal cardiovascular exam Rhythm:Regular Rate:Normal     Neuro/Psych    GI/Hepatic negative GI ROS, Neg liver ROS,,,  Endo/Other    Renal/GU      Musculoskeletal  (+) Arthritis ,    Abdominal   Peds  Hematology   Anesthesia Other Findings   Reproductive/Obstetrics                             Anesthesia Physical Anesthesia Plan  ASA: 2  Anesthesia Plan: MAC   Post-op Pain Management: Tylenol PO (pre-op)*   Induction: Intravenous  PONV Risk Score and Plan: Treatment may vary due to age or medical condition, Ondansetron and Propofol infusion  Airway Management Planned: Nasal Cannula and Natural Airway  Additional Equipment: None  Intra-op Plan:   Post-operative Plan:   Informed Consent: I have reviewed the patients History and Physical, chart, labs and discussed the procedure including the risks, benefits and alternatives for the proposed anesthesia with the patient or authorized representative who has indicated his/her understanding and acceptance.     Dental advisory given  Plan Discussed with: CRNA and Anesthesiologist  Anesthesia Plan Comments:        Anesthesia Quick Evaluation

## 2022-09-26 ENCOUNTER — Other Ambulatory Visit: Payer: Self-pay

## 2022-09-26 ENCOUNTER — Ambulatory Visit (HOSPITAL_COMMUNITY): Payer: BC Managed Care – PPO | Admitting: Certified Registered Nurse Anesthetist

## 2022-09-26 ENCOUNTER — Ambulatory Visit (HOSPITAL_COMMUNITY)
Admission: RE | Admit: 2022-09-26 | Discharge: 2022-09-26 | Disposition: A | Discharge status: Home / Self Care | Payer: BC Managed Care – PPO | Source: Ambulatory Visit | Attending: Surgery | Admitting: Surgery

## 2022-09-26 ENCOUNTER — Encounter (HOSPITAL_COMMUNITY)
Admission: RE | Disposition: A | Discharge status: Home / Self Care | Payer: Self-pay | Source: Ambulatory Visit | Attending: Surgery

## 2022-09-26 ENCOUNTER — Encounter (HOSPITAL_COMMUNITY): Payer: Self-pay | Admitting: Surgery

## 2022-09-26 DIAGNOSIS — F17200 Nicotine dependence, unspecified, uncomplicated: Secondary | ICD-10-CM | POA: Diagnosis not present

## 2022-09-26 DIAGNOSIS — R1900 Intra-abdominal and pelvic swelling, mass and lump, unspecified site: Secondary | ICD-10-CM | POA: Diagnosis not present

## 2022-09-26 DIAGNOSIS — R229 Localized swelling, mass and lump, unspecified: Secondary | ICD-10-CM | POA: Diagnosis not present

## 2022-09-26 DIAGNOSIS — D171 Benign lipomatous neoplasm of skin and subcutaneous tissue of trunk: Secondary | ICD-10-CM | POA: Diagnosis not present

## 2022-09-26 DIAGNOSIS — M1990 Unspecified osteoarthritis, unspecified site: Secondary | ICD-10-CM | POA: Diagnosis not present

## 2022-09-26 DIAGNOSIS — M7989 Other specified soft tissue disorders: Secondary | ICD-10-CM | POA: Diagnosis not present

## 2022-09-26 HISTORY — PX: MASS EXCISION: SHX2000

## 2022-09-26 SURGERY — EXCISION MASS
Anesthesia: Monitor Anesthesia Care | Laterality: Left

## 2022-09-26 MED ORDER — ACETAMINOPHEN 325 MG PO TABS: 650.0000 mg | ORAL_TABLET | ORAL | Status: DC | PRN

## 2022-09-26 MED ORDER — ONDANSETRON HCL 4 MG/2ML IJ SOLN: 4.0000 mg | Freq: Once | INTRAMUSCULAR | Status: DC | PRN

## 2022-09-26 MED ORDER — OXYCODONE HCL 5 MG PO TABS: 5.0000 mg | ORAL_TABLET | ORAL | Status: DC | PRN

## 2022-09-26 MED ORDER — KETOROLAC TROMETHAMINE 30 MG/ML IJ SOLN: 30.0000 mg | Freq: Once | INTRAMUSCULAR | Status: DC | PRN

## 2022-09-26 MED ORDER — PROPOFOL 10 MG/ML IV BOLUS
INTRAVENOUS | Status: DC | PRN
  Administered 2022-09-26: 30 mg via INTRAVENOUS

## 2022-09-26 MED ORDER — ACETAMINOPHEN 500 MG PO TABS
1000.0000 mg | ORAL_TABLET | ORAL | Status: AC
  Administered 2022-09-26: 1000 mg via ORAL
  Filled 2022-09-26: qty 2

## 2022-09-26 MED ORDER — TRAMADOL HCL 50 MG PO TABS: 50.0000 mg | ORAL_TABLET | Freq: Four times a day (QID) | ORAL | 0 refills | Status: AC | PRN

## 2022-09-26 MED ORDER — OXYCODONE HCL 5 MG PO TABS: 5.0000 mg | ORAL_TABLET | Freq: Once | ORAL | Status: DC | PRN

## 2022-09-26 MED ORDER — OXYCODONE HCL 5 MG/5ML PO SOLN: 5.0000 mg | Freq: Once | ORAL | Status: DC | PRN

## 2022-09-26 MED ORDER — CHLORHEXIDINE GLUCONATE CLOTH 2 % EX PADS: 6.0000 | MEDICATED_PAD | Freq: Once | CUTANEOUS | Status: DC

## 2022-09-26 MED ORDER — CEFAZOLIN SODIUM-DEXTROSE 2-4 GM/100ML-% IV SOLN
2.0000 g | INTRAVENOUS | Status: AC
  Administered 2022-09-26: 2 g via INTRAVENOUS
  Filled 2022-09-26: qty 100

## 2022-09-26 MED ORDER — SODIUM CHLORIDE 0.9% FLUSH: 3.0000 mL | Freq: Two times a day (BID) | INTRAVENOUS | Status: DC

## 2022-09-26 MED ORDER — BUPIVACAINE LIPOSOME 1.3 % IJ SUSP: 20.0000 mL | Freq: Once | INTRAMUSCULAR | Status: DC

## 2022-09-26 MED ORDER — LACTATED RINGERS IV SOLN: INTRAVENOUS | Status: DC

## 2022-09-26 MED ORDER — BUPIVACAINE-EPINEPHRINE 0.25% -1:200000 IJ SOLN
INTRAMUSCULAR | Status: DC | PRN
  Administered 2022-09-26: 8 mL

## 2022-09-26 MED ORDER — FENTANYL CITRATE (PF) 100 MCG/2ML IJ SOLN
INTRAMUSCULAR | Status: AC
  Filled 2022-09-26: qty 2

## 2022-09-26 MED ORDER — BUPIVACAINE HCL (PF) 0.5 % IJ SOLN
INTRAMUSCULAR | Status: AC
  Filled 2022-09-26: qty 30

## 2022-09-26 MED ORDER — HYDROMORPHONE HCL 1 MG/ML IJ SOLN: 0.2500 mg | INTRAMUSCULAR | Status: DC | PRN

## 2022-09-26 MED ORDER — FENTANYL CITRATE (PF) 100 MCG/2ML IJ SOLN
INTRAMUSCULAR | Status: DC | PRN
  Administered 2022-09-26 (×2): 50 ug via INTRAVENOUS

## 2022-09-26 MED ORDER — MIDAZOLAM HCL 2 MG/2ML IJ SOLN
INTRAMUSCULAR | Status: DC | PRN
  Administered 2022-09-26: 2 mg via INTRAVENOUS

## 2022-09-26 MED ORDER — CHLORHEXIDINE GLUCONATE 0.12 % MT SOLN
15.0000 mL | Freq: Once | OROMUCOSAL | Status: AC
  Administered 2022-09-26: 15 mL via OROMUCOSAL

## 2022-09-26 MED ORDER — SODIUM CHLORIDE 0.9% FLUSH: 3.0000 mL | INTRAVENOUS | Status: DC | PRN

## 2022-09-26 MED ORDER — SODIUM CHLORIDE 0.9 % IV SOLN: 250.0000 mL | INTRAVENOUS | Status: DC | PRN

## 2022-09-26 MED ORDER — PROPOFOL 500 MG/50ML IV EMUL
INTRAVENOUS | Status: DC | PRN
  Administered 2022-09-26: 125 ug/kg/min via INTRAVENOUS

## 2022-09-26 MED ORDER — ORAL CARE MOUTH RINSE: 15.0000 mL | Freq: Once | OROMUCOSAL | Status: AC

## 2022-09-26 MED ORDER — MIDAZOLAM HCL 2 MG/2ML IJ SOLN
INTRAMUSCULAR | Status: AC
  Filled 2022-09-26: qty 2

## 2022-09-26 MED ORDER — ACETAMINOPHEN 650 MG RE SUPP: 650.0000 mg | RECTAL | Status: DC | PRN

## 2022-09-26 MED ORDER — FENTANYL CITRATE PF 50 MCG/ML IJ SOSY: 25.0000 ug | PREFILLED_SYRINGE | INTRAMUSCULAR | Status: DC | PRN

## 2022-09-26 MED ORDER — GABAPENTIN 300 MG PO CAPS
300.0000 mg | ORAL_CAPSULE | ORAL | Status: AC
  Administered 2022-09-26: 300 mg via ORAL
  Filled 2022-09-26: qty 1

## 2022-09-26 SURGICAL SUPPLY — 30 items
BAG COUNTER SPONGE SURGICOUNT (BAG) IMPLANT
COVER SURGICAL LIGHT HANDLE (MISCELLANEOUS) ×1 IMPLANT
DERMABOND ADVANCED .7 DNX12 (GAUZE/BANDAGES/DRESSINGS) IMPLANT
DRAPE LAPAROSCOPIC ABDOMINAL (DRAPES) IMPLANT
DRAPE LAPAROTOMY T 102X78X121 (DRAPES) IMPLANT
DRAPE LAPAROTOMY TRNSV 102X78 (DRAPES) IMPLANT
DRAPE UTILITY XL STRL (DRAPES) ×1 IMPLANT
ELECT REM PT RETURN 15FT ADLT (MISCELLANEOUS) ×1 IMPLANT
GAUZE SPONGE 4X4 12PLY STRL (GAUZE/BANDAGES/DRESSINGS) ×1 IMPLANT
GLOVE BIO SURGEON STRL SZ 6 (GLOVE) ×1 IMPLANT
GLOVE INDICATOR 6.5 STRL GRN (GLOVE) ×1 IMPLANT
GOWN STRL REUS W/ TWL LRG LVL3 (GOWN DISPOSABLE) ×1 IMPLANT
GOWN STRL REUS W/ TWL XL LVL3 (GOWN DISPOSABLE) IMPLANT
GOWN STRL REUS W/TWL LRG LVL3 (GOWN DISPOSABLE) ×1
GOWN STRL REUS W/TWL XL LVL3 (GOWN DISPOSABLE)
KIT BASIN OR (CUSTOM PROCEDURE TRAY) ×1 IMPLANT
KIT TURNOVER KIT A (KITS) IMPLANT
MARKER SKIN DUAL TIP RULER LAB (MISCELLANEOUS) IMPLANT
NEEDLE HYPO 22GX1.5 SAFETY (NEEDLE) ×1 IMPLANT
PACK GENERAL/GYN (CUSTOM PROCEDURE TRAY) ×1 IMPLANT
SPIKE FLUID TRANSFER (MISCELLANEOUS) IMPLANT
STAPLER VISISTAT 35W (STAPLE) IMPLANT
SUT MNCRL AB 4-0 PS2 18 (SUTURE) ×1 IMPLANT
SUT VIC AB 3-0 SH 27 (SUTURE) ×1
SUT VIC AB 3-0 SH 27XBRD (SUTURE) ×1 IMPLANT
SYR BULB IRRIG 60ML STRL (SYRINGE) IMPLANT
SYR CONTROL 10ML LL (SYRINGE) ×1 IMPLANT
TOWEL OR 17X26 10 PK STRL BLUE (TOWEL DISPOSABLE) ×1 IMPLANT
TOWEL OR NON WOVEN STRL DISP B (DISPOSABLE) ×1 IMPLANT
YANKAUER SUCT BULB TIP 10FT TU (MISCELLANEOUS) IMPLANT

## 2022-09-26 NOTE — Op Note (Signed)
Operative Note  Clinton Pope  [id number]  [id number]  09/26/2022   Surgeon: Romana Juniper MD FACS   Procedure performed: Excision of abdominal wall mass 2 x 2 cm with layered wound closure   Preop diagnosis: Abdominal wall mass Post-op diagnosis/intraop findings: Same, below superficial fascia and abutting but not involving anterior sheath   Specimens: Abdominal wall mass Retained items: no  EBL: Minimal cc Complications: none   Description of procedure: After obtaining informed consent the patient was taken to the operating room and placed supine on operating room table where MAC was initiated, preoperative antibiotics were administered, SCDs applied, and a formal timeout was performed.  The abdominal wall was prepped and draped in usual sterile fashion.  After infiltration with local (0.25% Marcaine), a 2.5cm transverse incision was made along the Langer's lines left upper quadrant overlying the palpable mass.  The soft tissues were dissected with cautery until the surface of the mass was encountered.  This was completely excised intact, and noted to abut the anterior rectus sheath but did not involve it.  Hemostasis was ensured within the wound.  Additional local was infiltrated in the subcutaneous tissue and prefascial plane.  The wound was closed in a layered fashion beginning with the superficial fascia which was reapproximated with interrupted 3-0 Vicryl, followed by interrupted deep dermal 3-0 Vicryl  and running subcuticular 4-0 Monocryl sutures.  Dermabond was applied.  The patient was then awakened and taken to PACU in stable condition.    All counts were correct at the completion of the case.

## 2022-09-26 NOTE — Interval H&P Note (Signed)
History and Physical Interval Note:  09/26/2022 7:01 AM  Clinton Pope  has presented today for surgery, with the diagnosis of SUBCUTANEOUS MASS.  The various methods of treatment have been discussed with the patient and family. After consideration of risks, benefits and other options for treatment, the patient has consented to  Procedure(s): EXCISION OF LEFT UPPER ABDOMINAL WALL MASS (Left) as a surgical intervention.  The patient's history has been reviewed, patient examined, no change in status, stable for surgery.  I have reviewed the patient's chart and labs.  Questions were answered to the patient's satisfaction.     Ettie Krontz Rich Brave

## 2022-09-26 NOTE — Anesthesia Postprocedure Evaluation (Signed)
Anesthesia Post Note  Patient: Clinton Pope  Procedure(s) Performed: EXCISION OF LEFT UPPER ABDOMINAL WALL MASS (Left)     Patient location during evaluation: PACU Anesthesia Type: MAC Level of consciousness: awake and alert Pain management: pain level controlled Vital Signs Assessment: post-procedure vital signs reviewed and stable Respiratory status: spontaneous breathing, nonlabored ventilation, respiratory function stable and patient connected to nasal cannula oxygen Cardiovascular status: blood pressure returned to baseline and stable Postop Assessment: no apparent nausea or vomiting Anesthetic complications: no  No notable events documented.  Last Vitals:  Vitals:   09/26/22 0815 09/26/22 0830  BP: (!) 119/94 (!) 123/92  Pulse: 87 79  Resp: 18 16  Temp:  (!) 36.2 C  SpO2: 94% 95%    Last Pain:  Vitals:   09/26/22 0830  TempSrc:   PainSc: 0-No pain                 Barnet Glasgow

## 2022-09-26 NOTE — Anesthesia Procedure Notes (Signed)
Procedure Name: MAC Date/Time: 09/26/2022 7:21 AM  Performed by: Claudia Desanctis, CRNAPre-anesthesia Checklist: Patient identified, Emergency Drugs available, Suction available and Patient being monitored Patient Re-evaluated:Patient Re-evaluated prior to induction Oxygen Delivery Method: Simple face mask

## 2022-09-26 NOTE — Discharge Instructions (Addendum)
GENERAL SURGERY: POST OP INSTRUCTIONS  EAT Gradually transition to a high fiber diet with a fiber supplement over the next few weeks after discharge.  Start with a pureed / full liquid diet (see below)  WALK Walk an hour a day (cumulative, not all at once).  Control your pain to do that.    CONTROL PAIN Control pain so that you can walk, sleep, tolerate sneezing/coughing, go up/down stairs.  HAVE A BOWEL MOVEMENT DAILY Keep your bowels regular to avoid problems.  OK to try a laxative to override constipation.  OK to use an antidairrheal to slow down diarrhea.  Call if not better after 2 tries  CALL IF YOU HAVE PROBLEMS/CONCERNS Call if you are still struggling despite following these instructions. Call if you have concerns not answered by these instructions    DIET: Follow a light bland diet & liquids the first 24 hours after arrival home, such as soup, liquids, starches, etc.  Be sure to drink plenty of fluids.  Quickly advance to a usual solid diet within a few days.  Avoid fast food or heavy meals initially as you are more likely to get nauseated or have irregular bowels.  Take your usually prescribed home medications unless otherwise directed. PAIN CONTROL: Pain is best controlled by a usual combination of three different methods TOGETHER: Ice/Heat Over the counter pain medication Prescription pain medication Most patients will experience some swelling and bruising around the incisions.  Ice packs or heating pads (30-60 minutes up to 6 times a day) will help. Use ice for the first few days to help decrease swelling and bruising, then switch to heat to help relax tight/sore spots and speed recovery.  Some people prefer to use ice alone, heat alone, alternating between ice & heat.  Experiment to what works for you.  Swelling and bruising can take several weeks to resolve.   It is helpful to take an over-the-counter pain medication regularly for the first few weeks.  Choose one of the  following that works best for you: Naproxen (Aleve, etc)  Two '220mg'$  tabs twice a day OR Ibuprofen (Advil, etc) Three '200mg'$  tabs four times a day (every meal & bedtime) AND Acetaminophen (Tylenol, etc) 500-'650mg'$  four times a day (every meal & bedtime) A  prescription for pain medication (such as oxycodone, hydrocodone, etc) should be given to you upon discharge.  Take your pain medication as prescribed.  If you are having problems/concerns with the prescription medicine (does not control pain, nausea, vomiting, rash, itching, etc), please call us [phone] to see if we need to switch you to a different pain medicine that will work better for you and/or control your side effect better. If you need a refill on your pain medication, please contact your pharmacy.  They will contact our office to request authorization. Prescriptions will not be filled after 5 pm or on week-ends. Avoid getting constipated.  Between the surgery and the pain medications, it is common to experience some constipation.  Increasing fluid intake and taking a fiber supplement (such as Metamucil, Citrucel, FiberCon, MiraLax, etc) 1-2 times a day regularly will usually help prevent this problem from occurring.  A mild laxative (prune juice, Milk of Magnesia, MiraLax, etc) should be taken according to package directions if there are no bowel movements after 48 hours.   Wash / shower every day, starting 2 days after surgery.  You may shower over the skin glue which is waterproof.  No rubbing, scrubbing, lotions or ointments to  incision.  Do not submerge incision for at least 2 weeks or until completely healed.  Continue to shower over incision(s) after the dressing is off.  Glue will flake off after about 2 weeks.  Okay to peel off the remaining glue if any is still present after 2 weeks.  You may leave the incision open to air.  You may replace a dressing/Band-Aid to cover the incision for comfort if you wish.   ACTIVITIES as  tolerated:   You may resume regular (light) daily activities beginning the next day--such as daily self-care, walking, climbing stairs--gradually increasing activities as tolerated.  If you can walk 30 minutes without difficulty, it is safe to try more intense activity such as jogging, treadmill, bicycling, low-impact aerobics, swimming, etc. Save the most intensive and strenuous activity for last such as sit-ups, heavy lifting, contact sports, etc  Refrain from any heavy lifting or straining until you are off narcotics for pain control.   DO NOT PUSH THROUGH PAIN.  Let pain be your guide: If it hurts to do something, don't do it.  Pain is your body warning you to avoid that activity for another week until the pain goes down. You may drive when you are no longer taking prescription pain medication, you can comfortably wear a seatbelt, and you can safely maneuver your car and apply brakes. You may have sexual intercourse when it is comfortable.  FOLLOW UP in our office Please call [facility] at [phone] to set up an appointment to see your surgeon in the office for a follow-up appointment approximately 2-3 weeks after your surgery. Make sure that you call for this appointment the day you arrive home to insure a convenient appointment time. 9. IF YOU HAVE DISABILITY OR FAMILY LEAVE FORMS, BRING THEM TO THE OFFICE FOR PROCESSING.  DO NOT GIVE THEM TO YOUR DOCTOR.   WHEN TO CALL us [phone]: Poor pain control Reactions / problems with new medications (rash/itching, nausea, etc)  Fever over 101.5 F (38.5 C) Worsening swelling or bruising Continued bleeding from incision. Increased pain, redness, or drainage from the incision Difficulty breathing / swallowing   The clinic staff is available to answer your questions during regular business hours (8:30am-5pm).  Please don't hesitate to call and ask to speak to one of our nurses for clinical concerns.   If you have a medical emergency, go to the  nearest emergency room or call 911.  A surgeon from [facility] is always on call at the [facility], [place], [place], [place], [place]  [id number] ? MAIN: [phone] ? TOLL FREE: [phone] ?  FAX [phone] [email or web address]

## 2022-09-26 NOTE — Transfer of Care (Signed)
Immediate Anesthesia Transfer of Care Note  Patient: Clinton Pope  Procedure(s) Performed: EXCISION OF LEFT UPPER ABDOMINAL WALL MASS (Left)  Patient Location: PACU  Anesthesia Type:MAC  Level of Consciousness: awake  Airway & Oxygen Therapy: Patient Spontanous Breathing and Patient connected to face mask  Post-op Assessment: Report given to RN and Post -op Vital signs reviewed and stable  Post vital signs: Reviewed and stable  Last Vitals:  Vitals Value Taken Time  BP 98/70 09/26/22 0801  Temp 36.2 C 09/26/22 0801  Pulse 87 09/26/22 0806  Resp 15 09/26/22 0806  SpO2 99 % 09/26/22 0806  Vitals shown include unvalidated device data.  Last Pain:  Vitals:   09/26/22 0801  TempSrc:   PainSc: Asleep         Complications: No notable events documented.

## 2022-09-27 ENCOUNTER — Encounter (HOSPITAL_COMMUNITY): Payer: Self-pay | Admitting: Surgery

## 2022-09-29 LAB — SURGICAL PATHOLOGY

## 2022-12-12 ENCOUNTER — Encounter: Payer: Self-pay | Admitting: *Deleted

## 2023-01-13 ENCOUNTER — Emergency Department (HOSPITAL_BASED_OUTPATIENT_CLINIC_OR_DEPARTMENT_OTHER): Payer: BC Managed Care – PPO

## 2023-01-13 ENCOUNTER — Emergency Department (HOSPITAL_BASED_OUTPATIENT_CLINIC_OR_DEPARTMENT_OTHER)
Admission: EM | Admit: 2023-01-13 | Discharge: 2023-01-13 | Disposition: A | Discharge status: Home / Self Care | Payer: BC Managed Care – PPO | Attending: Emergency Medicine | Admitting: Emergency Medicine

## 2023-01-13 ENCOUNTER — Other Ambulatory Visit: Payer: Self-pay

## 2023-01-13 ENCOUNTER — Encounter (HOSPITAL_BASED_OUTPATIENT_CLINIC_OR_DEPARTMENT_OTHER): Payer: Self-pay | Admitting: Emergency Medicine

## 2023-01-13 DIAGNOSIS — R110 Nausea: Secondary | ICD-10-CM | POA: Diagnosis not present

## 2023-01-13 DIAGNOSIS — K8020 Calculus of gallbladder without cholecystitis without obstruction: Secondary | ICD-10-CM

## 2023-01-13 DIAGNOSIS — R9431 Abnormal electrocardiogram [ECG] [EKG]: Secondary | ICD-10-CM | POA: Diagnosis not present

## 2023-01-13 DIAGNOSIS — D72829 Elevated white blood cell count, unspecified: Secondary | ICD-10-CM | POA: Insufficient documentation

## 2023-01-13 DIAGNOSIS — K7689 Other specified diseases of liver: Secondary | ICD-10-CM | POA: Diagnosis not present

## 2023-01-13 DIAGNOSIS — R1011 Right upper quadrant pain: Secondary | ICD-10-CM | POA: Diagnosis not present

## 2023-01-13 LAB — CBC WITH DIFFERENTIAL/PLATELET
Abs Immature Granulocytes: 0.04 10*3/uL (ref 0.00–0.07)
Basophils Absolute: 0.1 10*3/uL (ref 0.0–0.1)
Basophils Relative: 1 %
Eosinophils Absolute: 0.2 10*3/uL (ref 0.0–0.5)
Eosinophils Relative: 2 %
HCT: 47.3 % (ref 39.0–52.0)
Hemoglobin: 16.1 g/dL (ref 13.0–17.0)
Immature Granulocytes: 0 %
Lymphocytes Relative: 29 %
Lymphs Abs: 3.2 10*3/uL (ref 0.7–4.0)
MCH: 30 pg (ref 26.0–34.0)
MCHC: 34 g/dL (ref 30.0–36.0)
MCV: 88.1 fL (ref 80.0–100.0)
Monocytes Absolute: 0.9 10*3/uL (ref 0.1–1.0)
Monocytes Relative: 8 %
Neutro Abs: 6.6 10*3/uL (ref 1.7–7.7)
Neutrophils Relative %: 60 %
Platelets: 321 10*3/uL (ref 150–400)
RBC: 5.37 MIL/uL (ref 4.22–5.81)
RDW: 13.6 % (ref 11.5–15.5)
WBC: 11 10*3/uL — ABNORMAL HIGH (ref 4.0–10.5)
nRBC: 0 % (ref 0.0–0.2)

## 2023-01-13 LAB — COMPREHENSIVE METABOLIC PANEL
ALT: 16 U/L (ref 0–44)
AST: 16 U/L (ref 15–41)
Albumin: 3.9 g/dL (ref 3.5–5.0)
Alkaline Phosphatase: 59 U/L (ref 38–126)
Anion gap: 8 (ref 5–15)
BUN: 19 mg/dL (ref 6–20)
CO2: 24 mmol/L (ref 22–32)
Calcium: 8.6 mg/dL — ABNORMAL LOW (ref 8.9–10.3)
Chloride: 107 mmol/L (ref 98–111)
Creatinine, Ser: 1.02 mg/dL (ref 0.61–1.24)
GFR, Estimated: >60 mL/min (ref >60)
Glucose, Bld: 118 mg/dL — ABNORMAL HIGH (ref 70–99)
Potassium: 4.1 mmol/L (ref 3.5–5.1)
Sodium: 139 mmol/L (ref 135–145)
Total Bilirubin: 0.2 mg/dL — ABNORMAL LOW (ref 0.3–1.2)
Total Protein: 6.9 g/dL (ref 6.5–8.1)

## 2023-01-13 LAB — LIPASE, BLOOD: Lipase: 37 U/L (ref 11–51)

## 2023-01-13 MED ORDER — FENTANYL CITRATE PF 50 MCG/ML IJ SOSY
100.0000 ug | PREFILLED_SYRINGE | Freq: Once | INTRAMUSCULAR | Status: AC
  Administered 2023-01-13: 100 ug via INTRAVENOUS
  Filled 2023-01-13: qty 2

## 2023-01-13 MED ORDER — ONDANSETRON HCL 4 MG/2ML IJ SOLN
4.0000 mg | Freq: Once | INTRAMUSCULAR | Status: AC
  Administered 2023-01-13: 4 mg via INTRAVENOUS
  Filled 2023-01-13: qty 2

## 2023-01-13 MED ORDER — FENTANYL CITRATE PF 50 MCG/ML IJ SOSY
50.0000 ug | PREFILLED_SYRINGE | INTRAMUSCULAR | Status: DC | PRN
  Administered 2023-01-13: 50 ug via INTRAVENOUS
  Filled 2023-01-13: qty 1

## 2023-01-13 MED ORDER — ONDANSETRON HCL 4 MG/2ML IJ SOLN: 4.0000 mg | Freq: Three times a day (TID) | INTRAMUSCULAR | Status: DC | PRN

## 2023-01-13 NOTE — Discharge Instructions (Addendum)
Your ultrasound today shows that you have stones in your gallbladder, likely causing your pain today.  Please stay away from greasy and fatty foods as this can exacerbate your pain.  You are being referred to a surgeon and should call their office today to set up outpatient follow-up for evaluation of possible gallbladder removal.  If your pain worsens or returns or does not go away, you develop fever, vomiting, or any other new/concerning symptoms then return to the ER or call 911.

## 2023-01-13 NOTE — ED Triage Notes (Signed)
Pt states upper right abdominal pain. States feels like "last time" had Gall bladder attack. Started at 0200 pt woken up due to pain.

## 2023-01-13 NOTE — ED Provider Notes (Signed)
Care transferred to me. Patient last received fentanyl about 3 hours ago. He has not had any pain since that dose, and states he feels completely fine. Thus, I doubt he has cholecystitis. I have personally viewed/interpreted his ultrasound images, has cholelithiasis. I discussed the results with him. Will refer him to surgery for outpatient evaluation. We discussed diet changes as well as return precautions. He declines anything for pain.    Pricilla Loveless, MD 01/13/23 1013

## 2023-01-13 NOTE — ED Notes (Signed)
US at bedside

## 2023-01-13 NOTE — ED Provider Notes (Signed)
EMERGENCY DEPARTMENT AT [facility] Provider Note   CSN: [id number] Arrival date & time: 01/13/23  0431     History  Chief Complaint  Patient presents with   Abdominal Pain    Clinton Pope is a 52 y.o. male.  The history is provided by the patient and the spouse.  Patient presents with acute right upper quadrant abdominal pain.  Patient reports the pain woke him up around 2 AM.  It is in his right upper quadrant that radiates to his back.  He reports nausea but no vomiting.  No fevers.  No chest pain or shortness of breath.  He reports symptoms previously, he thinks it is his gallbladder.  He does not recall ever having an abdominal ultrasound. He was otherwise at his baseline prior to this episode.      Home Medications Prior to Admission medications   Medication Sig Start Date End Date Taking? Authorizing Provider  naproxen sodium (ALEVE) 220 MG tablet Take 220 mg by mouth daily as needed (pain).    [provider]      Allergies    Patient has no known allergies.    Review of Systems   Review of Systems  Constitutional:  Negative for fever.  Gastrointestinal:  Positive for abdominal pain and nausea.    Physical Exam Updated Vital Signs BP (!) 141/109 (BP Location: Right Arm)   Pulse 81   Temp (!) 97.4 F (36.3 C) (Oral)   Resp (!) 22   Ht 1.829 m (6')   Wt 81.6 kg   SpO2 99%   BMI 24.41 kg/m  Physical Exam CONSTITUTIONAL: Well developed/well nourished, uncomfortable appearing HEAD: Normocephalic/atraumatic EYES: EOMI/PERRL, icterus noted ENMT: Mucous membranes moist NECK: supple no meningeal signs SPINE/BACK:entire spine nontender CV: S1/S2 noted, no murmurs/rubs/gallops noted LUNGS: Lungs are clear to auscultation bilaterally, no apparent distress ABDOMEN: soft, moderate right upper quadrant tenderness, no rebound or guarding, bowel sounds noted throughout abdomen NEURO: Pt is awake/alert/appropriate, moves all  extremitiesx4.  No facial droop.   EXTREMITIES: full ROM SKIN: warm, color normal PSYCH: Anxious ED Results / Procedures / Treatments   Labs (all labs ordered are listed, but only abnormal results are displayed) Labs Reviewed  COMPREHENSIVE METABOLIC PANEL - Abnormal; Notable for the following components:      Result Value   Glucose, Bld 118 (*)    Calcium 8.6 (*)    Total Bilirubin 0.2 (*)    All other components within normal limits  CBC WITH DIFFERENTIAL/PLATELET - Abnormal; Notable for the following components:   WBC 11.0 (*)    All other components within normal limits  LIPASE, BLOOD    EKG EKG Interpretation  Date/Time:  Friday Jan 13 2023 04:50:11 EDT Ventricular Rate:  75 PR Interval:  156 QRS Duration: 98 QT Interval:  385 QTC Calculation: 430 R Axis:   19 Text Interpretation: Sinus rhythm RSR' in V1 or V2, probably normal variant ST elev, probable normal early repol pattern No significant change since last tracing Confirmed by Zadie Rhine ([id number]) on 01/13/2023 4:52:16 AM  Radiology No results found.  Procedures Procedures    Medications Ordered in ED Medications  fentaNYL (SUBLIMAZE) injection 50 mcg (has no administration in time range)  ondansetron (ZOFRAN) injection 4 mg (has no administration in time range)  fentaNYL (SUBLIMAZE) injection 100 mcg (100 mcg Intravenous Given 01/13/23 0508)  ondansetron (ZOFRAN) injection 4 mg (4 mg Intravenous Given 01/13/23 0507)    ED Course/ Medical Decision Making/  A&P Clinical Course as of 01/13/23 0701  Fri Jan 13, 2023  0451 WBC(!): 11.0 Mild leukocytosis [DW]  0557 Patient improving, but still having right upper quadrant abdominal pain.  Plan will be to obtain a right upper quadrant ultrasound as patient has never had formal gallbladder imaging. [DW]  0701 Signed out to dr Criss Alvine with RUQ [place] pending Pt stable in the ED [DW]    Clinical Course User Index [DW] Zadie Rhine, MD                              Medical Decision Making Amount and/or Complexity of Data Reviewed Labs: ordered. Decision-making details documented in ED Course. Radiology: ordered.  Risk Prescription drug management.   This patient presents to the ED for concern of abdominal pain, this involves an extensive number of treatment options, and is a complaint that carries with it a high risk of complications and morbidity.  The differential diagnosis includes but is not limited to cholecystitis, cholelithiasis, pancreatitis, gastritis, peptic ulcer disease, appendicitis, bowel obstruction, bowel perforation, diverticulitis, AAA, ischemic bowel    Social Determinants of Health: Patient's  English is a second language   increases the complexity of managing their presentation  Additional history obtained: Additional history obtained from spouse Records reviewed  operative notes reviewed  Lab Tests: I Ordered, and personally interpreted labs.  The pertinent results include: Mild leukocytosis   Medicines ordered and prescription drug management: I ordered medication including fentanyl for pain Reevaluation of the patient after these medicines showed that the patient    improved  Reevaluation: After the interventions noted above, I reevaluated the patient and found that they have :improved  Complexity of problems addressed: Patient's presentation is most consistent with  acute presentation with potential threat to life or bodily function           Final Clinical Impression(s) / ED Diagnoses Final diagnoses:  RUQ abdominal pain    Rx / DC Orders ED Discharge Orders          Ordered    US ABDOMEN LIMITED RUQ (LIVER/GB)  Status:  Canceled        01/13/23 0547              Zadie Rhine, MD 01/13/23 [id number]

## 2023-01-16 ENCOUNTER — Telehealth: Payer: Self-pay

## 2023-01-16 NOTE — Transitions of Care (Post Inpatient/ED Visit) (Signed)
   01/16/2023  Name: Clinton Pope MRN: [id number] DOB: 03-Jun-1971  Today's TOC FU Call Status: Today's TOC FU Call Status:: Successful TOC FU Call Competed TOC FU Call Complete Date: 01/16/23  Transition Care Management Follow-up Telephone Call Date of Discharge: 01/13/23 Discharge Facility: [facility] Type of Discharge: Emergency Department Reason for ED Visit: Other: (Calculus of gallbladder without cholecystitis without obstruction) How have you been since you were released from the hospital?: Better Any questions or concerns?: No  Items Reviewed: Did you receive and understand the discharge instructions provided?: Yes Medications obtained,verified, and reconciled?: No Any new allergies since your discharge?: No Dietary orders reviewed?: Yes Do you have support at home?: Yes  Medications Reviewed Today: Medications Reviewed Today     Reviewed by Larey Dresser, RN (Registered Nurse) on 01/16/23 at 1501  Med List Status: <None>   Medication Order Taking? Sig Documenting Provider Last Dose Status Informant           No Medications to Display                            Home Care and Equipment/Supplies: Were Home Health Services Ordered?: NA Any new equipment or medical supplies ordered?: NA  Functional Questionnaire: Do you need assistance with bathing/showering or dressing?: No Do you need assistance with meal preparation?: No Do you need assistance with eating?: No Do you have difficulty maintaining continence: No Do you need assistance with getting out of bed/getting out of a chair/moving?: No Do you have difficulty managing or taking your medications?: No  Follow up appointments reviewed: Specialist Hospital Follow-up appointment confirmed?: Yes Date of Specialist follow-up appointment?: 01/19/23 Do you need transportation to your follow-up appointment?: No Do you understand care options if your condition(s) worsen?: Yes-patient  verbalized understanding    SIGNATURE Arvil Persons, BSN, RN

## 2023-01-19 ENCOUNTER — Ambulatory Visit: Payer: Self-pay | Admitting: Surgery

## 2023-01-19 DIAGNOSIS — K8050 Calculus of bile duct without cholangitis or cholecystitis without obstruction: Secondary | ICD-10-CM | POA: Diagnosis not present

## 2023-01-19 NOTE — H&P (Signed)
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  He developed right upper quadrant abdominal pain radiating to his back with associated nausea around 2 AM on 5/17 and went to the med center at [facility] for evaluation.  Reports prior to this he has had 2 similar episodes, 1 in 2021 and 1 in 2022.  His pain resolved in the ER after a dose of fentanyl and so he was able to discharge home and presents for follow-up today.  He did have a more mild attack last night as well.  Ultrasound demonstrated a positive sonographic Murphy sign and echogenic layering sludge and gallstones up to 14 mm.  No pericholecystic fluid or gallbladder wall thickening, common bile duct diameter was described as being 4 mm proximally and 8 mm distally without any intrahepatic biliary ductal dilatation, liver normal noting a 9 mm cyst near the gallbladder fossa and in the left lateral hepatic lobe.  CMP was normal, white count was 11 but CBC otherwise unremarkable    Review of Systems: A complete review of systems was obtained from the patient.  I have reviewed this information and discussed as appropriate with the patient.  See HPI as well for other ROS.   Medical History: History reviewed. No pertinent past medical history.  There is no problem list on file for this patient.   History reviewed. No pertinent surgical history.   No Known Allergies  No current outpatient medications on file prior to visit.   No current facility-administered medications on file prior to visit.    Family History  Problem Relation Age of Onset   Breast cancer Mother      Social History   Tobacco Use  Smoking Status Every Day   Current packs/day: 1.00   Types: Cigarettes  Smokeless Tobacco Never     Social History    Socioeconomic History   Marital status: Married  Tobacco Use   Smoking status: Every Day    Current packs/day: 1.00    Types: Cigarettes   Smokeless tobacco: Never  Vaping Use   Vaping status: Never Used  Substance and Sexual Activity   Alcohol use: Yes   Drug use: Never   Social Determinants of Health    Received from [facility], [facility]   Social Network    Objective:    Vitals:   01/19/23 1025  PainSc:   5    There is no height or weight on file to calculate BMI.  Gen: A&Ox3, no distress  Unlabored respirations Abdomen soft, mildly tender in the right upper quadrant  Assessment and Plan:  Diagnoses and all orders for this visit:  Biliary colic    I recommend proceeding with laparoscopic cholecystectomy with possible cholangiogram.  Discussed the relevant anatomy using a diagram to demonstrate, and went over surgical technique.  Discussed risks of surgery including bleeding, pain, scarring, intraabdominal injury specifically to the common bile duct and sequelae, subtotal cholecystectomy, bile leak, conversion to open surgery, failure to resolve symptoms, blood clots/ pulmonary embolus, heart attack, pneumonia, stroke, death. Questions welcomed and answered to patient's satisfaction.  Patient wishes to proceed with surgery.  Will try to get this scheduled to soon as possible given crescendoing frequency of attacks at this point.  Kushi Kun Carlye Grippe, MD

## 2023-01-19 NOTE — H&P (View-Only) (Signed)
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  He developed right upper quadrant abdominal pain radiating to his back with associated nausea around 2 AM on 5/17 and went to the med center at [place] for evaluation.  Reports prior to this he has had 2 similar episodes, 1 in 2021 and 1 in 2022.  His pain resolved in the ER after a dose of fentanyl and so he was able to discharge home and presents for follow-up today.  He did have a more mild attack last night as well.  Ultrasound demonstrated a positive sonographic Murphy sign and echogenic layering sludge and gallstones up to 14 mm.  No pericholecystic fluid or gallbladder wall thickening, common bile duct diameter was described as being 4 mm proximally and 8 mm distally without any intrahepatic biliary ductal dilatation, liver normal noting a 9 mm cyst near the gallbladder fossa and in the left lateral hepatic lobe.  CMP was normal, white count was 11 but CBC otherwise unremarkable    Review of Systems: A complete review of systems was obtained from the patient.  I have reviewed this information and discussed as appropriate with the patient.  See HPI as well for other ROS.   Medical History: History reviewed. No pertinent past medical history.  There is no problem list on file for this patient.   History reviewed. No pertinent surgical history.   No Known Allergies  No current outpatient medications on file prior to visit.   No current facility-administered medications on file prior to visit.    Family History  Problem Relation Age of Onset   Breast cancer Mother      Social History   Tobacco Use  Smoking Status Every Day   Current packs/day: 1.00   Types: Cigarettes  Smokeless Tobacco Never     Social History    Socioeconomic History   Marital status: Married  Tobacco Use   Smoking status: Every Day    Current packs/day: 1.00    Types: Cigarettes   Smokeless tobacco: Never  Vaping Use   Vaping status: Never Used  Substance and Sexual Activity   Alcohol use: Yes   Drug use: Never   Social Determinants of Health    [facility]   Social Network    Objective:    Vitals:   01/19/23 1025  PainSc:   5    There is no height or weight on file to calculate BMI.  Gen: A&Ox3, no distress  Unlabored respirations Abdomen soft, mildly tender in the right upper quadrant  Assessment and Plan:  Diagnoses and all orders for this visit:  Biliary colic    I recommend proceeding with laparoscopic cholecystectomy with possible cholangiogram.  Discussed the relevant anatomy using a diagram to demonstrate, and went over surgical technique.  Discussed risks of surgery including bleeding, pain, scarring, intraabdominal injury specifically to the common bile duct and sequelae, subtotal cholecystectomy, bile leak, conversion to open surgery, failure to resolve symptoms, blood clots/ pulmonary embolus, heart attack, pneumonia, stroke, death. Questions welcomed and answered to patient's satisfaction.  Patient wishes to proceed with surgery.  Will try to get this scheduled to soon as possible given crescendoing frequency of attacks at this point.  Kemar Pandit AMANDA Morayo Leven, MD      

## 2023-01-25 ENCOUNTER — Encounter (HOSPITAL_COMMUNITY): Payer: Self-pay | Admitting: Surgery

## 2023-01-25 NOTE — Progress Notes (Signed)
PCP - Dr Nadene Rubins Cardiologist - n/a  Chest x-ray - n/a EKG - 01/13/23 Stress Test - n/a ECHO - n/a Cardiac Cath - n/a  ICD Pacemaker/Loop - n/a  Sleep Study -  n/a CPAP - none  Diabetes - n/a  NPO  STOP now taking any Aspirin (unless otherwise instructed by your surgeon), Aleve, Naproxen, Ibuprofen, Motrin, Advil, Goody's, BC's, all herbal medications, fish oil, and all vitamins.   Coronavirus Screening Do you have any of the following symptoms:  Cough yes/no: No Fever (>100.70F)  yes/no: No Runny nose yes/no: No Sore throat yes/no: No Difficulty breathing/shortness of breath  yes/no: No  Have you traveled in the last 14 days and where? yes/no: No  Patient verbalized understanding of instructions that were given via phone.

## 2023-01-25 NOTE — Anesthesia Preprocedure Evaluation (Signed)
Anesthesia Evaluation  Patient identified by MRN, date of birth, ID band Patient awake    Reviewed: Allergy & Precautions, NPO status , Patient's Chart, lab work & pertinent test results  Airway Mallampati: II  TM Distance: >3 FB Neck ROM: Full    Dental  (+) Dental Advisory Given   Pulmonary neg shortness of breath, neg sleep apnea, neg COPD, neg recent URI, Current Smoker   Pulmonary exam normal breath sounds clear to auscultation       Cardiovascular (-) hypertension(-) angina (-) Past MI, (-) Cardiac Stents and (-) CABG negative cardio ROS (-) dysrhythmias  Rhythm:Regular Rate:Normal     Neuro/Psych negative neurological ROS     GI/Hepatic Neg liver ROS,neg GERD  ,,Biliary colic   Endo/Other  negative endocrine ROS    Renal/GU negative Renal ROS     Musculoskeletal   Abdominal   Peds  Hematology negative hematology ROS (+)   Anesthesia Other Findings   Reproductive/Obstetrics                             Anesthesia Physical Anesthesia Plan  ASA: 2  Anesthesia Plan: General   Post-op Pain Management: Tylenol PO (pre-op)*   Induction: Intravenous  PONV Risk Score and Plan: 1 and Ondansetron, Dexamethasone and Treatment may vary due to age or medical condition  Airway Management Planned: Oral ETT  Additional Equipment:   Intra-op Plan:   Post-operative Plan: Extubation in OR  Informed Consent: I have reviewed the patients History and Physical, chart, labs and discussed the procedure including the risks, benefits and alternatives for the proposed anesthesia with the patient or authorized representative who has indicated his/her understanding and acceptance.     Dental advisory given  Plan Discussed with: CRNA and Anesthesiologist  Anesthesia Plan Comments: (Risks of general anesthesia discussed including, but not limited to, sore throat, hoarse voice, chipped/damaged  teeth, injury to vocal cords, nausea and vomiting, allergic reactions, lung infection, heart attack, stroke, and death. All questions answered. )       Anesthesia Quick Evaluation

## 2023-01-26 ENCOUNTER — Other Ambulatory Visit: Payer: Self-pay

## 2023-01-26 ENCOUNTER — Encounter (HOSPITAL_COMMUNITY): Payer: Self-pay | Admitting: Surgery

## 2023-01-26 ENCOUNTER — Ambulatory Visit (HOSPITAL_COMMUNITY): Payer: BC Managed Care – PPO

## 2023-01-26 ENCOUNTER — Ambulatory Visit (HOSPITAL_COMMUNITY)
Admission: RE | Admit: 2023-01-26 | Discharge: 2023-01-26 | Disposition: A | Discharge status: Home / Self Care | Payer: BC Managed Care – PPO | Attending: Surgery | Admitting: Surgery

## 2023-01-26 ENCOUNTER — Encounter (HOSPITAL_COMMUNITY)
Admission: RE | Disposition: A | Discharge status: Home / Self Care | Payer: Self-pay | Source: Home / Self Care | Attending: Surgery

## 2023-01-26 ENCOUNTER — Ambulatory Visit (HOSPITAL_COMMUNITY): Payer: BC Managed Care – PPO | Admitting: Anesthesiology

## 2023-01-26 DIAGNOSIS — K660 Peritoneal adhesions (postprocedural) (postinfection): Secondary | ICD-10-CM | POA: Diagnosis not present

## 2023-01-26 DIAGNOSIS — K8064 Calculus of gallbladder and bile duct with chronic cholecystitis without obstruction: Secondary | ICD-10-CM | POA: Diagnosis not present

## 2023-01-26 DIAGNOSIS — F17210 Nicotine dependence, cigarettes, uncomplicated: Secondary | ICD-10-CM | POA: Insufficient documentation

## 2023-01-26 DIAGNOSIS — K819 Cholecystitis, unspecified: Secondary | ICD-10-CM | POA: Diagnosis not present

## 2023-01-26 DIAGNOSIS — K8010 Calculus of gallbladder with chronic cholecystitis without obstruction: Secondary | ICD-10-CM | POA: Diagnosis not present

## 2023-01-26 HISTORY — PX: CHOLECYSTECTOMY: SHX55

## 2023-01-26 SURGERY — LAPAROSCOPIC CHOLECYSTECTOMY WITH INTRAOPERATIVE CHOLANGIOGRAM
Anesthesia: General

## 2023-01-26 MED ORDER — CEFAZOLIN SODIUM-DEXTROSE 2-4 GM/100ML-% IV SOLN
2.0000 g | INTRAVENOUS | Status: AC
  Administered 2023-01-26: 2 g via INTRAVENOUS
  Filled 2023-01-26: qty 100

## 2023-01-26 MED ORDER — CHLORHEXIDINE GLUCONATE 4 % EX SOLN: 60.0000 mL | Freq: Once | CUTANEOUS | Status: DC

## 2023-01-26 MED ORDER — GABAPENTIN 300 MG PO CAPS
300.0000 mg | ORAL_CAPSULE | ORAL | Status: AC
  Administered 2023-01-26: 300 mg via ORAL
  Filled 2023-01-26: qty 1

## 2023-01-26 MED ORDER — FENTANYL CITRATE (PF) 250 MCG/5ML IJ SOLN
INTRAMUSCULAR | Status: DC | PRN
  Administered 2023-01-26 (×2): 100 ug via INTRAVENOUS
  Administered 2023-01-26: 50 ug via INTRAVENOUS

## 2023-01-26 MED ORDER — DEXAMETHASONE SODIUM PHOSPHATE 10 MG/ML IJ SOLN
INTRAMUSCULAR | Status: AC
  Filled 2023-01-26: qty 2

## 2023-01-26 MED ORDER — ACETAMINOPHEN 500 MG PO TABS
1000.0000 mg | ORAL_TABLET | ORAL | Status: AC
  Administered 2023-01-26: 1000 mg via ORAL
  Filled 2023-01-26: qty 2

## 2023-01-26 MED ORDER — BUPIVACAINE LIPOSOME 1.3 % IJ SUSP: 20.0000 mL | Freq: Once | INTRAMUSCULAR | Status: DC

## 2023-01-26 MED ORDER — LIDOCAINE 2% (20 MG/ML) 5 ML SYRINGE
INTRAMUSCULAR | Status: DC | PRN
  Administered 2023-01-26: 100 mg via INTRAVENOUS

## 2023-01-26 MED ORDER — BUPIVACAINE-EPINEPHRINE (PF) 0.25% -1:200000 IJ SOLN
INTRAMUSCULAR | Status: AC
  Filled 2023-01-26: qty 30

## 2023-01-26 MED ORDER — MIDAZOLAM HCL 2 MG/2ML IJ SOLN
INTRAMUSCULAR | Status: DC | PRN
  Administered 2023-01-26: 2 mg via INTRAVENOUS

## 2023-01-26 MED ORDER — FENTANYL CITRATE (PF) 250 MCG/5ML IJ SOLN
INTRAMUSCULAR | Status: AC
  Filled 2023-01-26: qty 5

## 2023-01-26 MED ORDER — ROCURONIUM BROMIDE 10 MG/ML (PF) SYRINGE
PREFILLED_SYRINGE | INTRAVENOUS | Status: DC | PRN
  Administered 2023-01-26: 10 mg via INTRAVENOUS
  Administered 2023-01-26: 50 mg via INTRAVENOUS

## 2023-01-26 MED ORDER — BUPIVACAINE-EPINEPHRINE 0.25% -1:200000 IJ SOLN
INTRAMUSCULAR | Status: DC | PRN
  Administered 2023-01-26: 10 mL

## 2023-01-26 MED ORDER — FENTANYL CITRATE (PF) 100 MCG/2ML IJ SOLN
25.0000 ug | INTRAMUSCULAR | Status: DC | PRN
  Administered 2023-01-26 (×2): 50 ug via INTRAVENOUS

## 2023-01-26 MED ORDER — OXYCODONE HCL 5 MG/5ML PO SOLN: 5.0000 mg | Freq: Once | ORAL | Status: DC | PRN

## 2023-01-26 MED ORDER — LIDOCAINE 2% (20 MG/ML) 5 ML SYRINGE
INTRAMUSCULAR | Status: AC
  Filled 2023-01-26: qty 10

## 2023-01-26 MED ORDER — ACETAMINOPHEN 500 MG PO TABS: 1000.0000 mg | ORAL_TABLET | Freq: Once | ORAL | Status: AC

## 2023-01-26 MED ORDER — ONDANSETRON HCL 4 MG/2ML IJ SOLN
INTRAMUSCULAR | Status: DC | PRN
  Administered 2023-01-26: 4 mg via INTRAVENOUS

## 2023-01-26 MED ORDER — ESMOLOL HCL 100 MG/10ML IV SOLN
INTRAVENOUS | Status: DC | PRN
  Administered 2023-01-26: 50 mg via INTRAVENOUS

## 2023-01-26 MED ORDER — ROCURONIUM BROMIDE 10 MG/ML (PF) SYRINGE
PREFILLED_SYRINGE | INTRAVENOUS | Status: AC
  Filled 2023-01-26: qty 20

## 2023-01-26 MED ORDER — FENTANYL CITRATE (PF) 100 MCG/2ML IJ SOLN
INTRAMUSCULAR | Status: AC
  Filled 2023-01-26: qty 2

## 2023-01-26 MED ORDER — PROPOFOL 10 MG/ML IV BOLUS
INTRAVENOUS | Status: DC | PRN
  Administered 2023-01-26: 40 mg via INTRAVENOUS
  Administered 2023-01-26: 160 mg via INTRAVENOUS
  Administered 2023-01-26: 50 mg via INTRAVENOUS

## 2023-01-26 MED ORDER — CHLORHEXIDINE GLUCONATE 0.12 % MT SOLN
15.0000 mL | Freq: Once | OROMUCOSAL | Status: AC
  Administered 2023-01-26: 15 mL via OROMUCOSAL

## 2023-01-26 MED ORDER — INDOCYANINE GREEN 25 MG IV SOLR
7.5000 mg | Freq: Once | INTRAVENOUS | Status: AC
  Administered 2023-01-26: 7.5 mg via INTRAVENOUS
  Filled 2023-01-26: qty 10

## 2023-01-26 MED ORDER — PROPOFOL 500 MG/50ML IV EMUL
INTRAVENOUS | Status: DC | PRN
  Administered 2023-01-26: 100 ug/kg/min via INTRAVENOUS

## 2023-01-26 MED ORDER — LACTATED RINGERS IV SOLN: INTRAVENOUS | Status: DC

## 2023-01-26 MED ORDER — ONDANSETRON HCL 4 MG/2ML IJ SOLN
INTRAMUSCULAR | Status: AC
  Filled 2023-01-26: qty 4

## 2023-01-26 MED ORDER — ORAL CARE MOUTH RINSE: 15.0000 mL | Freq: Once | OROMUCOSAL | Status: AC

## 2023-01-26 MED ORDER — SUGAMMADEX SODIUM 200 MG/2ML IV SOLN
INTRAVENOUS | Status: DC | PRN
  Administered 2023-01-26: 163.2 mg via INTRAVENOUS

## 2023-01-26 MED ORDER — DEXAMETHASONE SODIUM PHOSPHATE 10 MG/ML IJ SOLN
INTRAMUSCULAR | Status: DC | PRN
  Administered 2023-01-26: 10 mg via INTRAVENOUS

## 2023-01-26 MED ORDER — MIDAZOLAM HCL 2 MG/2ML IJ SOLN
INTRAMUSCULAR | Status: AC
  Filled 2023-01-26: qty 2

## 2023-01-26 MED ORDER — OXYCODONE HCL 5 MG PO TABS: 5.0000 mg | ORAL_TABLET | Freq: Once | ORAL | Status: DC | PRN

## 2023-01-26 MED ORDER — AMISULPRIDE (ANTIEMETIC) 5 MG/2ML IV SOLN: 10.0000 mg | Freq: Once | INTRAVENOUS | Status: DC | PRN

## 2023-01-26 MED ORDER — LABETALOL HCL 5 MG/ML IV SOLN
INTRAVENOUS | Status: DC | PRN
  Administered 2023-01-26 (×2): 5 mg via INTRAVENOUS

## 2023-01-26 SURGICAL SUPPLY — 50 items
ADH SKN CLS APL DERMABOND .7 (GAUZE/BANDAGES/DRESSINGS) ×1
ADH SKN CLS LQ APL DERMABOND (GAUZE/BANDAGES/DRESSINGS) ×1
APL PRP STRL LF DISP 70% ISPRP (MISCELLANEOUS) ×1
APPLIER CLIP 5 13 M/L LIGAMAX5 (MISCELLANEOUS) ×1
APR CLP MED LRG 5 ANG JAW (MISCELLANEOUS) ×1
BAG COUNTER SPONGE SURGICOUNT (BAG) ×1 IMPLANT
BAG SPNG CNTER NS LX DISP (BAG) ×1
CANISTER SUCT 3000ML PPV (MISCELLANEOUS) ×1 IMPLANT
CHLORAPREP W/TINT 26 (MISCELLANEOUS) ×1 IMPLANT
CLIP APPLIE 5 13 M/L LIGAMAX5 (MISCELLANEOUS) ×1 IMPLANT
CNTNR URN SCR LID CUP LEK RST (MISCELLANEOUS) ×1 IMPLANT
CONT SPEC 4OZ STRL OR WHT (MISCELLANEOUS) ×1
COVER MAYO STAND STRL (DRAPES) IMPLANT
COVER SURGICAL LIGHT HANDLE (MISCELLANEOUS) ×1 IMPLANT
DERMABOND ADVANCED .7 DNX12 (GAUZE/BANDAGES/DRESSINGS) ×1 IMPLANT
DERMABOND ADVANCED .7 DNX6 (GAUZE/BANDAGES/DRESSINGS) IMPLANT
DRAPE C-ARM 42X120 X-RAY (DRAPES) IMPLANT
ELECT REM PT RETURN 9FT ADLT (ELECTROSURGICAL) ×1
ELECTRODE REM PT RTRN 9FT ADLT (ELECTROSURGICAL) ×1 IMPLANT
ENDOLOOP SUT PDS II  0 18 (SUTURE) ×1
ENDOLOOP SUT PDS II 0 18 (SUTURE) IMPLANT
GAUZE 4X4 16PLY ~~LOC~~+RFID DBL (SPONGE) ×1 IMPLANT
GLOVE BIO SURGEON STRL SZ 6 (GLOVE) ×1 IMPLANT
GLOVE INDICATOR 6.5 STRL GRN (GLOVE) ×1 IMPLANT
GOWN STRL REUS W/ TWL LRG LVL3 (GOWN DISPOSABLE) ×3 IMPLANT
GOWN STRL REUS W/TWL LRG LVL3 (GOWN DISPOSABLE) ×3
GRASPER SUT TROCAR 14GX15 (MISCELLANEOUS) ×1 IMPLANT
IRRIG SUCT STRYKERFLOW 2 WTIP (MISCELLANEOUS) ×1
IRRIGATION SUCT STRKRFLW 2 WTP (MISCELLANEOUS) ×1 IMPLANT
KIT BASIN OR (CUSTOM PROCEDURE TRAY) ×1 IMPLANT
KIT TURNOVER KIT B (KITS) ×1 IMPLANT
NDL INSUFFLATION 14GA 120MM (NEEDLE) ×1 IMPLANT
NEEDLE INSUFFLATION 14GA 120MM (NEEDLE) ×1 IMPLANT
NS IRRIG 1000ML POUR BTL (IV SOLUTION) ×1 IMPLANT
PAD ARMBOARD 7.5X6 YLW CONV (MISCELLANEOUS) ×1 IMPLANT
SCISSORS LAP 5X35 DISP (ENDOMECHANICALS) ×1 IMPLANT
SET CHOLANGIOGRAPH 5 50 .035 (SET/KITS/TRAYS/PACK) IMPLANT
SET TUBE SMOKE EVAC HIGH FLOW (TUBING) ×1 IMPLANT
SLEEVE Z-THREAD 5X100MM (TROCAR) ×2 IMPLANT
SPONGE T-LAP 18X18 ~~LOC~~+RFID (SPONGE) ×1 IMPLANT
SUT MNCRL AB 4-0 PS2 18 (SUTURE) ×1 IMPLANT
SYS BAG RETRIEVAL 10MM (BASKET) ×1
SYSTEM BAG RETRIEVAL 10MM (BASKET) ×1 IMPLANT
TOWEL GREEN STERILE (TOWEL DISPOSABLE) ×1 IMPLANT
TOWEL GREEN STERILE FF (TOWEL DISPOSABLE) ×1 IMPLANT
TRAY LAPAROSCOPIC MC (CUSTOM PROCEDURE TRAY) ×1 IMPLANT
TROCAR 11X100 Z THREAD (TROCAR) ×1 IMPLANT
TROCAR Z-THREAD OPTICAL 5X100M (TROCAR) ×1 IMPLANT
WARMER LAPAROSCOPE (MISCELLANEOUS) ×1 IMPLANT
WATER STERILE IRR 1000ML POUR (IV SOLUTION) ×1 IMPLANT

## 2023-01-26 NOTE — Op Note (Signed)
Operative Note  Clinton Pope 52 y.o. male [id number]  01/26/2023  Surgeon: Berna Bue MD FACS  Procedure performed: Laparoscopic Cholecystectomy with near-infrared fluorescent cholangiography  Procedure classification: urgent  Preop diagnosis: cholecystitis Post-op diagnosis/intraop findings: same  Specimens: gallbladder  Retained items: none  EBL: minimal  Complications: none  Description of procedure: After obtaining informed consent the patient was brought to the operating room. Antibiotics were administered. SCD's were applied. General endotracheal anesthesia was initiated and a formal time-out was performed. The abdomen was prepped and draped in the usual sterile fashion and the abdomen was entered using an infraumbilical veress needle after instilling the site with local. Insufflation to was obtained, 5mm trocar and camera inserted, and gross inspection revealed no evidence of injury from our entry or other intraabdominal abnormalities. Two 5mm trocars were introduced in the right midclavicular and right anterior axillary lines under direct visualization and following infiltration with local. An 11mm trocar was placed in the epigastrium.  Omental adhesions to the anterior liver and gallbladder fundus were carefully taken down bluntly and with cautery.  The gallbladder fundus was unable to be directed cephalad.  Additional adhesions of omentum and duodenum to the body and infundibulum were very carefully bluntly dissected away until the infundibulum could be grasped and retracted laterally.  A careful dissection using blunt and hook electrocautery proceeded to encircle the gallbladder neck and infundibulum, proceeding towards the cystic duct which was chronically dilated and encased in cicatrix.  We were able to identify the cystic artery which was small and was divided with cautery.  The gallbladder was lifted from the cystic plate.  At this point near infrared  fluorescent cholangiography was employed.  The common bile duct was visible well away from the area of dissection, and we confirmed the appropriate anatomy.  Given the significant scarring around the cystic duct, I was not able to get further down this without risking significant injury and therefore we proceeded to take the gallbladder entirely off of the liver bed using cautery until the cystic duct/gallbladder neck was the only structure connecting the gallbladder to the patient. Two 0 PDS Endoloops were placed across the gallbladder neck/cystic duct junction and secured.  The gallbladder neck was then divided distal to both of these Endoloops.  Visible mucosa was ablated with cautery.  The gallbladder was placed in an Endo Catch bag and removed through the epigastric trocar site.  Upper quadrant was then irrigated and the effluent was clear.  Hemostasis was once again confirmed, and reinspection of the abdomen revealed no injuries. The endoloops were well opposed without any bile leak from the cystic duct stump or the liver bed. The 11mm trocar site in the epigastrium was closed with a 0 vicryl in the fascia under direct visualization using a PMI device. The abdomen was desufflated and all trocars removed. The skin incisions were closed with subcuticular 4-0 monocryl and Dermabond. The patient was awakened, extubated and transported to the recovery room in stable condition.    All counts were correct at the completion of the case.

## 2023-01-26 NOTE — Anesthesia Procedure Notes (Signed)
Procedure Name: Intubation Date/Time: 01/26/2023 7:32 AM  Performed by: Darryl Nestle, CRNAPre-anesthesia Checklist: Patient identified, Emergency Drugs available, Suction available and Patient being monitored Patient Re-evaluated:Patient Re-evaluated prior to induction Oxygen Delivery Method: Circle system utilized Preoxygenation: Pre-oxygenation with 100% oxygen Induction Type: IV induction Ventilation: Mask ventilation without difficulty Laryngoscope Size: Mac and 4 Grade View: Grade II Tube type: Oral Tube size: 7.0 mm Number of attempts: 1 Airway Equipment and Method: Stylet and Oral airway Placement Confirmation: ETT inserted through vocal cords under direct vision, positive ETCO2 and breath sounds checked- equal and bilateral Secured at: 23 cm Tube secured with: Tape Dental Injury: Teeth and Oropharynx as per pre-operative assessment

## 2023-01-26 NOTE — Interval H&P Note (Signed)
History and Physical Interval Note:  01/26/2023 6:37 AM  Clinton Pope  has presented today for surgery, with the diagnosis of BILIARY COLIC.  The various methods of treatment have been discussed with the patient and family. After consideration of risks, benefits and other options for treatment, the patient has consented to  Procedure(s): LAPAROSCOPIC CHOLECYSTECTOMY WITH ICG DYE, POSSIBLE INTRAOPERATIVE CHOLANGIOGRAM (N/A) as a surgical intervention.  The patient's history has been reviewed, patient examined, no change in status, stable for surgery.  I have reviewed the patient's chart and labs.  Questions were answered to the patient's satisfaction.     [name]

## 2023-01-26 NOTE — Transfer of Care (Signed)
Immediate Anesthesia Transfer of Care Note  Patient: Clinton Pope  Procedure(s) Performed: LAPAROSCOPIC CHOLECYSTECTOMY WITH ICG Clinton Pope  Patient Location: PACU  Anesthesia Type:General  Level of Consciousness: sedated  Airway & Oxygen Therapy: Patient Spontanous Breathing and Patient connected to face mask oxygen  Post-op Assessment: Report given to RN and Post -op Vital signs reviewed and stable  Post vital signs: Reviewed and stable  Last Vitals:  Vitals Value Taken Time  BP 150/101 01/26/23 0856  Temp 98   Pulse 75 01/26/23 0859  Resp 17 01/26/23 0859  SpO2 98 % 01/26/23 0859  Vitals shown include unvalidated device data.  Last Pain:  Vitals:   01/26/23 0601  TempSrc: Oral  PainSc: 0-No pain         Complications: No notable events documented.

## 2023-01-26 NOTE — Discharge Instructions (Signed)
LAPAROSCOPIC SURGERY: POST OP INSTRUCTIONS   EAT Gradually transition to a high fiber diet with a fiber supplement over the next few weeks after discharge.  Start with a pureed / full liquid diet (see below)  WALK Walk an hour a day (cumulative- not all at once).  Control your pain to do that.    CONTROL PAIN Control pain so that you can walk, sleep, tolerate sneezing/coughing, go up/down stairs.  HAVE A BOWEL MOVEMENT DAILY Keep your bowels regular to avoid problems.  OK to try a laxative to override constipation.  OK to use an antidiarrheal to slow down diarrhea.  Call if not better after 2 tries  CALL IF YOU HAVE PROBLEMS/CONCERNS Call if you are still struggling despite following these instructions. Call if you have concerns not answered by these instructions    DIET: Follow a light bland diet & liquids the first 24 hours after arrival home, such as soup, liquids, starches, etc.  Be sure to drink plenty of fluids.  Quickly advance to a usual solid diet within a few days.  Avoid fast food or heavy meals initially as you are more likely to get nauseated or have irregular bowels.  A low-sugar, high-fiber diet for the rest of your life is ideal.  Take your usually prescribed home medications unless otherwise directed.  PAIN CONTROL: Pain is best controlled by a usual combination of three different methods TOGETHER: Ice/Heat Over the counter pain medication Prescription pain medication Most patients will experience some swelling and bruising around the incisions.  Ice packs or heating pads (30-60 minutes up to 6 times a day) will help. Use ice for the first few days to help decrease swelling and bruising, then switch to heat to help relax tight/sore spots and speed recovery.  Some people prefer to use ice alone, heat alone, alternating between ice & heat.  Experiment to what works for you.  Swelling and bruising can take several weeks to resolve.   It is helpful to take an  over-the-counter pain medication regularly for the first few days: Naproxen (Aleve, etc)  Two 220mg  tabs twice a day OR Ibuprofen (Advil, etc) Three 200mg  tabs four times a day (every meal & bedtime) AND Acetaminophen (Tylenol, etc) 500-650mg  four times a day (every meal & bedtime) You may take the pain medications you have at home from your prior surgery, IF NEEDED.  If you are having problems/concerns with the prescription medicine (does not control pain, nausea, vomiting, rash, itching, etc), please call us [phone] to see if we need to switch you to a different pain medicine that will work better for you and/or control your side effect better. If you need a refill on your pain medication, please give [place] 48 hour notice.  contact your pharmacy.  They will contact our office to request authorization. Prescriptions will not be filled after 5 pm or on week-ends  Avoid getting constipated.   Between the surgery and the pain medications, it is common to experience some constipation.   Increasing fluid intake and taking a fiber supplement (such as Metamucil, Citrucel, FiberCon, MiraLax, etc) 1-2 times a day regularly will usually help prevent this problem from occurring.   A mild laxative (prune juice, Milk of Magnesia, MiraLax, etc) should be taken according to package directions if there are no bowel movements after 48 hours.   Watch out for diarrhea.   If you have many loose bowel movements, simplify your diet to bland foods & liquids for a few  days.   Stop any stool softeners and decrease your fiber supplement.   Switching to mild anti-diarrheal medications (Kayopectate, Pepto Bismol) can help.   If this worsens or does not improve, please call us.  Wash / shower every day.  You may shower over the skin glue which is waterproof.  Do not soak or submerge incisions.  No rubbing, scrubbing, lotions or ointments to incisions.  Glue will flake off after about 2 weeks.  You may leave the incision  open to air.  You may replace a dressing/Band-Aid to cover the incision for comfort if you wish.   ACTIVITIES as tolerated:   You may resume regular (light) daily activities beginning the next day--such as daily self-care, walking, climbing stairs--gradually increasing activities as tolerated.  If you can walk 30 minutes without difficulty, it is safe to try more intense activity such as jogging, treadmill, bicycling, low-impact aerobics, swimming, etc. Save the most intensive and strenuous activity for last such as sit-ups, heavy lifting, contact sports, etc  Refrain from any heavy lifting or straining until you are off narcotics for pain control.   DO NOT PUSH THROUGH PAIN.  Let pain be your guide: If it hurts to do something, don't do it.  Pain is your body warning you to avoid that activity for another week until the pain goes down. You may drive when you are no longer taking prescription pain medication, you can comfortably wear a seatbelt, and you can safely maneuver your car and apply brakes. You may have sexual intercourse when it is comfortable.  FOLLOW UP in our office Please call [facility] at [phone] to set up an appointment to see your surgeon in the office for a follow-up appointment approximately 2-3 weeks after your surgery. Make sure that you call for this appointment the day you arrive home to insure a convenient appointment time.  10. IF YOU HAVE DISABILITY OR FAMILY LEAVE FORMS, BRING THEM TO THE OFFICE FOR PROCESSING.  DO NOT GIVE THEM TO YOUR DOCTOR.   WHEN TO CALL us [phone]: Poor pain control Reactions / problems with new medications (rash/itching, nausea, etc)  Fever over 101.5 F (38.5 C) Inability to urinate Nausea and/or vomiting Worsening swelling or bruising Continued bleeding from incision. Increased pain, redness, or drainage from the incision   The clinic staff is available to answer your questions during regular business hours (8:30am-5pm).  Please  don't hesitate to call and ask to speak to one of our nurses for clinical concerns.   If you have a medical emergency, go to the nearest emergency room or call 911.  A surgeon from [facility] is always on call at the [facility], [place] [address], [place], [place]  [address] ? MAIN: [phone] ? TOLL FREE: [phone] ?  FAX [phone] [email or web address]

## 2023-01-26 NOTE — Anesthesia Postprocedure Evaluation (Signed)
Anesthesia Post Note  Patient: Clinton Pope  Procedure(s) Performed: LAPAROSCOPIC CHOLECYSTECTOMY WITH ICG DYE     Patient location during evaluation: PACU Anesthesia Type: General Level of consciousness: awake Pain management: pain level controlled Vital Signs Assessment: post-procedure vital signs reviewed and stable Respiratory status: spontaneous breathing, nonlabored ventilation and respiratory function stable Cardiovascular status: blood pressure returned to baseline and stable Postop Assessment: no apparent nausea or vomiting Anesthetic complications: no   No notable events documented.  Last Vitals:  Vitals:   01/26/23 1000 01/26/23 1004  BP: (!) 141/98   Pulse: 74 72  Resp: 16 13  Temp:  36.5 C  SpO2: 94% 93%    Last Pain:  Vitals:   01/26/23 0945  TempSrc:   PainSc: 6                  [name]

## 2023-01-27 ENCOUNTER — Encounter (HOSPITAL_COMMUNITY): Payer: Self-pay | Admitting: Surgery

## 2023-01-27 LAB — SURGICAL PATHOLOGY

## 2023-11-20 ENCOUNTER — Encounter: Payer: Self-pay | Admitting: Family Medicine

## 2023-11-20 ENCOUNTER — Ambulatory Visit (INDEPENDENT_AMBULATORY_CARE_PROVIDER_SITE_OTHER): Admitting: Family Medicine

## 2023-11-20 VITALS — BP 120/90 | HR 100 | Temp 97.8°F | Ht 72.0 in | Wt 172.6 lb

## 2023-11-20 DIAGNOSIS — Z13220 Encounter for screening for lipoid disorders: Secondary | ICD-10-CM | POA: Diagnosis not present

## 2023-11-20 DIAGNOSIS — Z131 Encounter for screening for diabetes mellitus: Secondary | ICD-10-CM

## 2023-11-20 DIAGNOSIS — Z87891 Personal history of nicotine dependence: Secondary | ICD-10-CM

## 2023-11-20 DIAGNOSIS — Z0000 Encounter for general adult medical examination without abnormal findings: Secondary | ICD-10-CM

## 2023-11-20 DIAGNOSIS — Z1211 Encounter for screening for malignant neoplasm of colon: Secondary | ICD-10-CM

## 2023-11-20 DIAGNOSIS — Z125 Encounter for screening for malignant neoplasm of prostate: Secondary | ICD-10-CM | POA: Diagnosis not present

## 2023-11-20 DIAGNOSIS — Z23 Encounter for immunization: Secondary | ICD-10-CM

## 2023-11-20 LAB — LIPID PANEL
Cholesterol: 200 mg/dL (ref 0–200)
HDL: 34.1 mg/dL — ABNORMAL LOW (ref >39.00)
LDL Cholesterol: 126 mg/dL — ABNORMAL HIGH (ref 0–99)
NonHDL: 166.29
Total CHOL/HDL Ratio: 6
Triglycerides: 199 mg/dL — ABNORMAL HIGH (ref 0.0–149.0)
VLDL: 39.8 mg/dL (ref 0.0–40.0)

## 2023-11-20 LAB — CBC
HCT: 48.7 % (ref 39.0–52.0)
Hemoglobin: 16.3 g/dL (ref 13.0–17.0)
MCHC: 33.4 g/dL (ref 30.0–36.0)
MCV: 89.2 fl (ref 78.0–100.0)
Platelets: 335 10*3/uL (ref 150.0–400.0)
RBC: 5.46 Mil/uL (ref 4.22–5.81)
RDW: 12.7 % (ref 11.5–15.5)
WBC: 7.7 10*3/uL (ref 4.0–10.5)

## 2023-11-20 LAB — URINALYSIS, ROUTINE W REFLEX MICROSCOPIC
Bilirubin Urine: NEGATIVE
Hgb urine dipstick: NEGATIVE
Ketones, ur: NEGATIVE
Leukocytes,Ua: NEGATIVE
Nitrite: NEGATIVE
RBC / HPF: NONE SEEN (ref 0–?)
Specific Gravity, Urine: >=1.03 — AB (ref 1.000–1.030)
Total Protein, Urine: NEGATIVE
Urine Glucose: NEGATIVE
Urobilinogen, UA: 0.2 (ref 0.0–1.0)
pH: 5.5 (ref 5.0–8.0)

## 2023-11-20 LAB — COMPREHENSIVE METABOLIC PANEL
ALT: 29 U/L (ref 0–53)
AST: 13 U/L (ref 0–37)
Albumin: 4.5 g/dL (ref 3.5–5.2)
Alkaline Phosphatase: 62 U/L (ref 39–117)
BUN: 18 mg/dL (ref 6–23)
CO2: 28 meq/L (ref 19–32)
Calcium: 9.4 mg/dL (ref 8.4–10.5)
Chloride: 103 meq/L (ref 96–112)
Creatinine, Ser: 1.02 mg/dL (ref 0.40–1.50)
GFR: 83.4 mL/min (ref >60.00)
Glucose, Bld: 99 mg/dL (ref 70–99)
Potassium: 4.5 meq/L (ref 3.5–5.1)
Sodium: 139 meq/L (ref 135–145)
Total Bilirubin: 0.5 mg/dL (ref 0.2–1.2)
Total Protein: 7 g/dL (ref 6.0–8.3)

## 2023-11-20 LAB — PSA: PSA: 1.19 ng/mL (ref 0.10–4.00)

## 2023-11-20 LAB — HEMOGLOBIN A1C: Hgb A1c MFr Bld: 5.6 % (ref 4.6–6.5)

## 2023-11-20 NOTE — Progress Notes (Signed)
 Established Patient Office Visit   Subjective:  Patient ID: Clinton Pope, male    DOB: [birth year]  Age: 53 y.o. MRN: [id number]  Chief Complaint  Patient presents with   Annual Exam    Intermittent left side abdominal pain    HPI Encounter Diagnoses  Name Primary?   Healthcare maintenance Yes   Screening for colon cancer    Screening for cholesterol level    Screening for diabetes mellitus    Immunization due    Screening for prostate cancer    History of tobacco use disorder   Here for physical today.  Doing relatively well.  He quit smoking in October of this past year along with his wife.  He had smoked a pack a day for 35 years.  He is seeing the dentist regularly.  No regular exercise.  Has never had a colonoscopy.  He has seen no blood in his stool and denies hematochezia.  No constipation or diarrhea.  Urine flow is excellent.  Denies hematuria.    Review of Systems  Constitutional: Negative.   HENT: Negative.    Eyes:  Negative for blurred vision, discharge and redness.  Respiratory: Negative.    Cardiovascular: Negative.   Gastrointestinal:  Negative for abdominal pain.  Genitourinary: Negative.   Musculoskeletal: Negative.  Negative for myalgias.  Skin:  Negative for rash.  Neurological:  Negative for tingling, loss of consciousness and weakness.  Endo/Heme/Allergies:  Negative for polydipsia.      11/20/2023    8:40 AM 09/05/2022    2:37 PM  Depression screen PHQ 2/9  Decreased Interest 0 0  Down, Depressed, Hopeless 0 0  PHQ - 2 Score 0 0      Current Outpatient Medications:    naproxen sodium (ALEVE) 220 MG tablet, Take 220 mg by mouth 2 (two) times daily as needed (pain)., Disp: , Rfl:    Objective:     BP (!) 120/90 (BP Location: Right Arm, Patient Position: Sitting)   Pulse 100   Temp 97.8 F (36.6 C) (Temporal)   Ht 6' (1.829 m)   Wt 172 lb 9.6 oz (78.3 kg)   SpO2 98%   BMI 23.41 kg/m    Physical Exam Constitutional:       General: He is not in acute distress.    Appearance: Normal appearance. He is not ill-appearing, toxic-appearing or diaphoretic.  HENT:     Head: Normocephalic and atraumatic.     Right Ear: External ear normal.     Left Ear: External ear normal.     Mouth/Throat:     Mouth: Mucous membranes are moist.     Pharynx: Oropharynx is clear. No oropharyngeal exudate or posterior oropharyngeal erythema.  Eyes:     General: No scleral icterus.       Right eye: No discharge.        Left eye: No discharge.     Extraocular Movements: Extraocular movements intact.     Conjunctiva/sclera: Conjunctivae normal.     Pupils: Pupils are equal, round, and reactive to light.  Cardiovascular:     Rate and Rhythm: Normal rate and regular rhythm.  Pulmonary:     Effort: Pulmonary effort is normal. No respiratory distress.     Breath sounds: Normal breath sounds.  Abdominal:     General: Bowel sounds are normal.     Tenderness: There is no abdominal tenderness. There is no guarding.  Musculoskeletal:     Cervical back: No rigidity or tenderness.  Skin:    General: Skin is warm and dry.  Neurological:     Mental Status: He is alert and oriented to person, place, and time.  Psychiatric:        Mood and Affect: Mood normal.        Behavior: Behavior normal.      No results found for any visits on 11/20/23.    The ASCVD Risk score (Arnett DK, et al., 2019) failed to calculate for the following reasons:   Cannot find a previous HDL lab   Cannot find a previous total cholesterol lab    Assessment & Plan:   Healthcare maintenance -     CBC -     Urinalysis, Routine w reflex microscopic  Screening for colon cancer -     Ambulatory referral to Gastroenterology  Screening for cholesterol level -     Comprehensive metabolic panel -     Lipid panel  Screening for diabetes mellitus -     Comprehensive metabolic panel -     Hemoglobin A1c  Immunization due -     Varicella-zoster vaccine  IM  Screening for prostate cancer -     PSA  History of tobacco use disorder -     Ambulatory Referral for Lung Cancer Scre    Return in about 3 months (around 02/20/2024), or For follow-up and second Shingrix vaccine..  Information was given on health maintenance and disease prevention as well as exercising to stay healthy.  Recommended 150 minutes of exercise weekly.  Agrees to go for Colonoscopy and low-dose CT screening for lung cancer.  Patient declines flu vaccine.  Mliss Sax, MD

## 2023-11-21 ENCOUNTER — Encounter: Payer: Self-pay | Admitting: Family Medicine

## 2023-11-23 ENCOUNTER — Encounter: Admitting: Family Medicine

## 2023-11-28 ENCOUNTER — Encounter: Payer: Self-pay | Admitting: Physician Assistant

## 2023-12-01 ENCOUNTER — Telehealth: Payer: Self-pay | Admitting: Family Medicine

## 2023-12-01 ENCOUNTER — Ambulatory Visit: Admitting: Family Medicine

## 2023-12-01 ENCOUNTER — Ambulatory Visit: Payer: Self-pay | Admitting: Family Medicine

## 2023-12-01 ENCOUNTER — Encounter: Payer: Self-pay | Admitting: Family Medicine

## 2023-12-01 VITALS — BP 118/86 | HR 98 | Temp 97.6°F | Ht 72.0 in | Wt 174.2 lb

## 2023-12-01 DIAGNOSIS — R42 Dizziness and giddiness: Secondary | ICD-10-CM

## 2023-12-01 NOTE — Telephone Encounter (Signed)
 Chief Complaint: Dizziness for last 10 days  Symptoms: Lightheaded, woozy, "off balance," fatigue Pertinent Negatives: Patient denies fever, chest pain, nausea, vomiting. Difficulty breathing, numbness  Disposition:[x] Appointment(In office)  Additional Notes: Pt states he was sick with the stomach bug on 3/9 and then had an annual physical last Monday with Dr. Doreene Burke. Pt got a shingles shot and was sick after that. Pt did not discuss symptoms with Dr. Doreene Burke at this time as he was not concerned. Pt not sure if any of this is related to his constant dizziness for the past two weeks. Pt scheduled for an appt today at a different office as no availability at PCP office today. This RN educated pt on home care, new-worsening symptoms, when to call back/seek emergent care. Pt verbalized understanding and agrees to plan.   Reason for Disposition  [1] MODERATE dizziness (e.g., interferes with normal activities) AND [2] has NOT been evaluated by doctor (or NP/PA) for this  (Exception: Dizziness caused by heat exposure, sudden standing, or poor fluid intake.)  Answer Assessment - Initial Assessment Questions DESCRIPTION: "Describe your dizziness."     "Feels like I am little bit behind myself," "slightly drunk" LIGHTHEADED: "Do you feel lightheaded?" (e.g., somewhat faint, woozy, weak upon standing)     Woozy, a little weak upon standing; worse in morning and evenings is better VERTIGO: "Do you feel like either you or the room is spinning or tilting?" (i.e. vertigo)     A little "off balance" SEVERITY: "How bad is it?"  "Do you feel like you are going to faint?" "Can you stand and walk?"   - MILD: Feels slightly dizzy, but walking normally.   - MODERATE: Feels unsteady when walking, but not falling; interferes with normal activities (e.g., school, work).   - SEVERE: Unable to walk without falling, or requires assistance to walk without falling; feels like passing out now.      Mild ONSET:  "When did  the dizziness begin?"     10 days ago AGGRAVATING FACTORS: "Does anything make it worse?" (e.g., standing, change in head position)     Denies HEART RATE: "Can you tell me your heart rate?" "How many beats in 15 seconds?"  (Note: not all patients can do this)       "Everything normal when had physical" RECURRENT SYMPTOM: "Have you had dizziness before?" If Yes, ask: "When was the last time?" "What happened that time?"     Denies OTHER SYMPTOMS: "Do you have any other symptoms?" (e.g., fever, chest pain, vomiting, diarrhea, bleeding)       Denies  Protocols used: Dizziness - Lightheadedness-A-AH

## 2023-12-01 NOTE — Telephone Encounter (Signed)
 FYI: This call has been transferred to triage nurse: the Triage Nurse. Once the result note has been entered staff can address the message at that time.  Patient called in with the following symptoms:  Red Word:dizziness, pt scheduled via mychart an OV with Dr Doreene Burke on 12/21/23 for        Dizziness and lightheadedness for the last 10 days or so that doesn't go away. I called pt and he stated he felt drunk all the time. I transferred him over to nurse triage.    Please advise at [facility] [phone]  Message is routed to Provider Pool.

## 2023-12-01 NOTE — Progress Notes (Signed)
 Chief Complaint  Patient presents with   Dizziness    Patient presents today for dizziness for about 2- 4 weeks now.    Clinton Pope is 53 y.o. pt here for dizziness.  Duration: 2 weeks Pass out? No Fairely constant, waxes and wanes in severity.  Better after he eats.  Lightheadedness.  Spinning? No Recent illness/fever? Yes-  11/05/23 had Norovirus and had lingering issues since then.  Headache? Yes Neurologic signs? Yes Change in PO intake? No Palpitations? No  History reviewed. No pertinent past medical history.  Family History  Problem Relation Age of Onset   Cancer Mother    Early death Mother    Breast cancer Mother    Early death Father     Allergies as of 12/01/2023   No Known Allergies      Medication List    as of December 01, 2023  1:53 PM   You have not been prescribed any medications.     BP 118/86   Pulse 98   Temp 97.6 F (36.4 C)   Ht 6' (1.829 m)   Wt 174 lb 3.2 oz (79 kg)   SpO2 99%   BMI 23.63 kg/m  General: Awake, alert, appears stated age Eyes: PERRLA, EOMi Ears: Patent, TM's neg b/l Heart: RRR, no murmurs, no carotid bruits Lungs: CTAB, no accessory muscle use MSK: 5/5 strength throughout, gait normal Neuro: No cerebellar signs, patellar reflex 2/4 b/l wo clonus, calcaneal reflex 0/4 b/l wo clonus, biceps reflex 1/4 b/l wo clonus; Dix-Hall-Pike negative b/l. Psych: Age appropriate judgment and insight, normal mood and affect  Lightheadedness  Could be hypovolemia 2/2 Norovirus. Hydration w electrolytes. Compression stockings. Consider INCS. Send message in a few weeks if no better, will refer to cards. Recent labs reviewed. High specific gravity. EKG shows NSR, normal axis, no interval abnormalities, no ST segment or T wave changes, good R wave progression.  F/u prn. Pt voiced understanding and agreement to the plan.  I spent 24 min w the pt discussion the above plan in addition to reviewing his chart on the same day of the visit.    Jilda Roche Lyman, DO 12/01/23 1:53 PM

## 2023-12-01 NOTE — Telephone Encounter (Signed)
 Noted. Pt is scheduled to see Raphael Gibney MD today @ 1:45pm

## 2023-12-01 NOTE — Patient Instructions (Signed)
 Stay hydrated. Continue using electrolytes in your fluids.   Consider compression stockings.  Get up slowly.   Stay active.   Send me a message in 2 weeks if not improved and we will set you up with a heart specialist.   Let us know if you need anything.

## 2023-12-04 ENCOUNTER — Encounter: Admitting: Family Medicine

## 2023-12-05 ENCOUNTER — Other Ambulatory Visit: Payer: Self-pay | Admitting: *Deleted

## 2023-12-05 ENCOUNTER — Telehealth: Payer: Self-pay | Admitting: *Deleted

## 2023-12-05 DIAGNOSIS — Z87891 Personal history of nicotine dependence: Secondary | ICD-10-CM

## 2023-12-05 DIAGNOSIS — Z122 Encounter for screening for malignant neoplasm of respiratory organs: Secondary | ICD-10-CM

## 2023-12-05 NOTE — Telephone Encounter (Signed)
 Lung Cancer Screening Narrative/Criteria Questionnaire (Cigarette Smokers Only- No Cigars/Pipes/vapes)   Clinton Pope   SDMV:12/19/23 11:30- Natalie                                           October 24, 1970              LDCT: 12/19/23 2:30- MHP    52 y.o.   Phone: [phone]  Lung Screening Narrative (confirm age 69-77 yrs Medicare / 50-80 yrs Private pay insurance)   Insurance information:BCBS   Referring Provider:[facility]   This screening involves an initial phone call with a team member from our program. It is called a shared decision making visit. The initial meeting is required by insurance and Medicare to make sure you understand the program. This appointment takes about 15-20 minutes to complete. The CT scan will completed at a separate date/time. This scan takes about 5-10 minutes to complete and you may eat and drink before and after the scan.  Criteria questions for Lung Cancer Screening:   Are you a current or former smoker? Former Age began smoking: 18   If you are a former smoker, what year did you quit smoking? 2024 (within 15 yrs)   To calculate your smoking history, I need an accurate estimate of how many packs of cigarettes you smoked per day and for how many years. (Not just the number of PPD you are now smoking)   Years smoking 33 x Packs per day 1 = Pack years 33   (at least 20 pack yrs)   (Make sure they understand that we need to know how much they have smoked in the past, not just the number of PPD they are smoking now)  Do you have a personal history of cancer?  No    Do you have a family history of cancer? Yes  (cancer type and and relative) mother (breast)  Are you coughing up blood?  No  Have you had unexplained weight loss of 15 lbs or more in the last 6 months? No  It looks like you meet all criteria.     Additional information: N/A

## 2023-12-19 ENCOUNTER — Ambulatory Visit (HOSPITAL_BASED_OUTPATIENT_CLINIC_OR_DEPARTMENT_OTHER)
Admission: RE | Admit: 2023-12-19 | Discharge: 2023-12-19 | Disposition: A | Discharge status: Home / Self Care | Source: Ambulatory Visit | Attending: Acute Care | Admitting: Acute Care

## 2023-12-19 ENCOUNTER — Ambulatory Visit (INDEPENDENT_AMBULATORY_CARE_PROVIDER_SITE_OTHER): Admitting: Acute Care

## 2023-12-19 DIAGNOSIS — Z87891 Personal history of nicotine dependence: Secondary | ICD-10-CM

## 2023-12-19 DIAGNOSIS — Z122 Encounter for screening for malignant neoplasm of respiratory organs: Secondary | ICD-10-CM | POA: Diagnosis not present

## 2023-12-19 NOTE — Patient Instructions (Signed)

## 2023-12-19 NOTE — Progress Notes (Addendum)
 Provider Attestation I agree with the documentation of the Shared Decision Making visit,  smoking cessation counseling if appropriate, and verification or eligibility for lung cancer screening as documented by the RN Nurse Navigator.   Raejean Bullock, MSN, AGACNP-BC [facility] See Amion for personal pager PCCM on call pager [phone]      Virtual Visit via Telephone Note  I connected with Clinton Pope on 12/19/23 at 11:30 AM EDT by telephone and verified that I am speaking with the correct person using two identifiers.  Location: Patient: Warehouse manager Provider: Alyse Bach, RN   I discussed the limitations, risks, security and privacy concerns of performing an evaluation and management service by telephone and the availability of in person appointments. I also discussed with the patient that there may be a patient responsible charge related to this service. The patient expressed understanding and agreed to proceed.    Shared Decision Making Visit Lung Cancer Screening Program [phone])   Eligibility: Age 53 y.o. Pack Years Smoking History Calculation 33 (# packs/per year x # years smoked) Recent History of coughing up blood  no Unexplained weight loss? no ( >Than 15 pounds within the last 6 months ) Prior History Lung / other cancer no (Diagnosis within the last 5 years already requiring surveillance chest CT Scans). Smoking Status Former Smoker Former Smokers: Years since quit: < 1 year  Quit Date: 2024  Visit Components: Discussion included one or more decision making aids. yes Discussion included risk/benefits of screening. yes Discussion included potential follow up diagnostic testing for abnormal scans. yes Discussion included meaning and risk of over diagnosis. yes Discussion included meaning and risk of False Positives. yes Discussion included meaning of total radiation exposure. yes  Counseling Included: Importance  of adherence to annual lung cancer LDCT screening. yes Impact of comorbidities on ability to participate in the program. yes Ability and willingness to under diagnostic treatment. yes  Smoking Cessation Counseling: Current Smokers:  Discussed importance of smoking cessation. yes Information about tobacco cessation classes and interventions provided to patient. yes Patient provided with "ticket" for LDCT Scan. no Symptomatic Patient. no  Counseling(Intermediate counseling: > three minutes) 99406 Diagnosis Code: Tobacco Use Z72.0 Asymptomatic Patient yes  Counseling (Intermediate counseling: > three minutes counseling) U1324 Former Smokers:  Discussed the importance of maintaining cigarette abstinence. yes Diagnosis Code: Personal History of Nicotine Dependence. M01.027 Information about tobacco cessation classes and interventions provided to patient. Yes Patient provided with "ticket" for LDCT Scan. no Written Order for Lung Cancer Screening with LDCT placed in Epic. Yes (CT Chest Lung Cancer Screening Low Dose W/O CM) [id number] Z12.2-Screening of respiratory organs Z87.891-Personal history of nicotine dependence   Alyse Bach, RN

## 2023-12-21 ENCOUNTER — Ambulatory Visit: Admitting: Family Medicine

## 2023-12-28 ENCOUNTER — Encounter: Payer: Self-pay | Admitting: Physician Assistant

## 2023-12-28 ENCOUNTER — Ambulatory Visit: Admitting: Physician Assistant

## 2023-12-28 VITALS — BP 122/94 | HR 88 | Ht 72.0 in | Wt 174.0 lb

## 2023-12-28 DIAGNOSIS — Z1211 Encounter for screening for malignant neoplasm of colon: Secondary | ICD-10-CM

## 2023-12-28 DIAGNOSIS — R140 Abdominal distension (gaseous): Secondary | ICD-10-CM

## 2023-12-28 MED ORDER — NA SULFATE-K SULFATE-MG SULF 17.5-3.13-1.6 GM/177ML PO SOLN: 1.0000 | Freq: Once | ORAL | 0 refills | Status: AC

## 2023-12-28 NOTE — Progress Notes (Signed)
 Chief Complaint: Bloating, discuss colonoscopy  HPI:    Clinton Pope is a 53 year old male with a past medical history as listed below including previous cholecystectomy in June 2024, who was referred to me by Tonna Frederic,* for a complaint of bloating and to discuss a colonoscopy.      11/16/2023 CMP, CBC normal.  Hemoglobin A1c normal.    Today, the patient presents to clinic, he is actually from [place].  Explains today that he is doing fairly well GI wise.  Status post cholecystectomy in June of last year and initially had a lot of gas that would build up mostly on the left side of his abdomen, was daily and now only occurs once every couple of weeks and feels better if he walks around.  He does not feel like this is a problem at all.  Tells me he has never had screening for colorectal cancer.    Denies fever, chills, weight loss, family history of colon cancer, change in bowel habits or symptoms that awaken him from sleep.  Past Medical History:  Diagnosis Date   Gallbladder disease     Past Surgical History:  Procedure Laterality Date   CHOLECYSTECTOMY N/A 01/26/2023   Procedure: LAPAROSCOPIC CHOLECYSTECTOMY WITH ICG DYE;  Surgeon: Adalberto Acton, MD;  Location: [facility] OR;  Service: General;  Laterality: N/A;   ESOPHAGOGASTRODUODENOSCOPY  2009   EYE SURGERY  2010   Lasik   Groin surgery Bilateral    at 26-36 weeks old   MASS EXCISION Left 09/26/2022   Procedure: EXCISION OF LEFT UPPER ABDOMINAL WALL MASS;  Surgeon: Adalberto Acton, MD;  Location: [facility];  Service: General;  Laterality: Left;    Current Outpatient Medications  Medication Sig Dispense Refill   Na Sulfate-K Sulfate-Mg Sulfate concentrate (SUPREP) 17.5-3.13-1.6 GM/177ML SOLN Take 1 kit (354 mLs total) by mouth once for 1 dose. 354 mL 0   No current facility-administered medications for this visit.    Allergies as of 12/28/2023   (No Known Allergies)    Family History  Problem Relation Age of Onset    Early death Mother    Breast cancer Mother    Early death Father     Social History   Socioeconomic History   Marital status: Married    Spouse name: Not on file   Number of children: 0   Years of education: Not on file   Highest education level: Some college, no degree  Occupational History   Not on file  Tobacco Use   Smoking status: Former    Current packs/day: 0.00    Average packs/day: 1 pack/day for 33.8 years (33.8 ttl pk-yrs)    Types: Cigarettes    Start date: 23    Quit date: 06/16/2023    Years since quitting: 0.5   Smokeless tobacco: Never  Vaping Use   Vaping status: Never Used  Substance and Sexual Activity   Alcohol use: Yes    Alcohol/week: 1.0 standard drink of alcohol    Types: 1 Cans of beer per week    Comment: Maybe 1 beer/month   Drug use: Never   Sexual activity: Yes    Partners: Female  Other Topics Concern   Not on file  Social History Narrative   Not on file   Social Drivers of Health   Financial Resource Strain: Low Risk  (11/17/2023)   Overall Financial Resource Strain (CARDIA)    Difficulty of Paying Living Expenses: Not hard at all  Food Insecurity: No Food Insecurity (11/17/2023)   Hunger Vital Sign    Worried About Running Out of Food in the Last Year: Never true    Ran Out of Food in the Last Year: Never true  Transportation Needs: No Transportation Needs (11/17/2023)   PRAPARE - Administrator, Civil Service (Medical): No    Lack of Transportation (Non-Medical): No  Physical Activity: Insufficiently Active (11/17/2023)   Exercise Vital Sign    Days of Exercise per Week: 1 day    Minutes of Exercise per Session: 10 min  Stress: Stress Concern Present (11/17/2023)   [employer] of Occupational Health - Occupational Stress Questionnaire    Feeling of Stress : To some extent  Social Connections: Socially Isolated (11/17/2023)   Social Connection and Isolation Panel [NHANES]    Frequency of Communication with  Friends and Family: Never    Frequency of Social Gatherings with Friends and Family: Once a week    Attends Religious Services: Never    Database administrator or Organizations: No    Attends Engineer, structural: Not on file    Marital Status: Married  Intimate Partner Violence: Unknown (08/01/2022)   Received from [employer], [facility]   HITS    Physically Hurt: Not on file    Insult or Talk Down To: Not on file    Threaten Physical Harm: Not on file    Scream or Curse: Not on file    Review of Systems:    Constitutional: No weight loss, fever or chills Skin: No rash  Cardiovascular: No chest pain  Respiratory: No SOB Gastrointestinal: See HPI and otherwise negative Genitourinary: No dysuria Neurological: No headache, dizziness or syncope Musculoskeletal: No new muscle or joint pain Hematologic: No bleeding  Psychiatric: No history of depression or anxiety   Physical Exam:  Vital signs: BP (!) 120/100 (BP Location: Left Arm, Patient Position: Sitting, Cuff Size: Normal)   Pulse 88   Ht 6' (1.829 m) Comment: height measured without shoes  Wt 174 lb (78.9 kg)   BMI 23.60 kg/m   Constitutional:   Pleasant Caucasian male appears to be in NAD, Well developed, Well nourished, alert and cooperative Head:  Normocephalic and atraumatic. Eyes:   PEERL, EOMI. No icterus. Conjunctiva pink. Ears:  Normal auditory acuity. Neck:  Supple Throat: Oral cavity and pharynx without inflammation, swelling or lesion.  Respiratory: Respirations even and unlabored. Lungs clear to auscultation bilaterally.   No wheezes, crackles, or rhonchi.  Cardiovascular: Normal S1, S2. No MRG. Regular rate and rhythm. No peripheral edema, cyanosis or pallor.  Gastrointestinal:  Soft, nondistended, nontender. No rebound or guarding. Normal bowel sounds. No appreciable masses or hepatomegaly. Rectal:  Not performed.  Msk:  Symmetrical without gross deformities. Without edema, no deformity or  joint abnormality.  Neurologic:  Alert and  oriented x4;  grossly normal neurologically.  Skin:   Dry and intact without significant lesions or rashes. Psychiatric:  Demonstrates good judgement and reason without abnormal affect or behaviors.  RELEVANT LABS AND IMAGING: CBC    Component Value Date/Time   WBC 7.7 11/20/2023 0929   RBC 5.46 11/20/2023 0929   HGB 16.3 11/20/2023 0929   HCT 48.7 11/20/2023 0929   PLT 335.0 11/20/2023 0929   MCV 89.2 11/20/2023 0929   MCH 30.0 01/13/2023 0444   MCHC 33.4 11/20/2023 0929   RDW 12.7 11/20/2023 0929   LYMPHSABS 3.2 01/13/2023 0444   MONOABS 0.9 01/13/2023 0444  EOSABS 0.2 01/13/2023 0444   BASOSABS 0.1 01/13/2023 0444    CMP     Component Value Date/Time   NA 139 11/20/2023 0929   K 4.5 11/20/2023 0929   CL 103 11/20/2023 0929   CO2 28 11/20/2023 0929   GLUCOSE 99 11/20/2023 0929   BUN 18 11/20/2023 0929   CREATININE 1.02 11/20/2023 0929   CALCIUM 9.4 11/20/2023 0929   PROT 7.0 11/20/2023 0929   ALBUMIN 4.5 11/20/2023 0929   AST 13 11/20/2023 0929   ALT 29 11/20/2023 0929   ALKPHOS 62 11/20/2023 0929   BILITOT 0.5 11/20/2023 0929   GFRNONAA >60 01/13/2023 0444    Assessment: 1.  Screening for colorectal cancer: Patient is 40 and never had screening 2.  Bloating: Initially after this is cholecystectomy, not so much now; likely related to cholecystectomy  Plan: 1.  Scheduled patient for screening colonoscopy in the [facility] with Dr. Willy Harvest.  Did provide the patient a detailed list of risks for the  procedure and he agrees to proceed. Patient is appropriate for endoscopic procedure(s) in the ambulatory ([facility]) setting.  2.  Patient to follow in clinic per recommendations after time of procedure.  Reginal Capra, PA-C [facility] 12/28/2023, 10:05 AM  Cc: Tonna Frederic

## 2023-12-28 NOTE — Patient Instructions (Signed)
 You have been scheduled for a colonoscopy. Please follow written instructions given to you at your visit today.   If you use inhalers (even only as needed), please bring them with you on the day of your procedure.  DO NOT TAKE 7 DAYS PRIOR TO TEST- Trulicity (dulaglutide) Ozempic, Wegovy (semaglutide) Mounjaro (tirzepatide) Bydureon Bcise (exanatide extended release)  DO NOT TAKE 1 DAY PRIOR TO YOUR TEST Rybelsus (semaglutide) Adlyxin (lixisenatide) Victoza (liraglutide) Byetta (exanatide) ________________________________________________________________________  _______________________________________________________  If your blood pressure at your visit was 140/90 or greater, please contact your primary care physician to follow up on this.  _______________________________________________________  If you are age 55 or older, your body mass index should be between 23-30. Your Body mass index is 23.6 kg/m. If this is out of the aforementioned range listed, please consider follow up with your Primary Care Provider.  If you are age 84 or younger, your body mass index should be between 19-25. Your Body mass index is 23.6 kg/m. If this is out of the aformentioned range listed, please consider follow up with your Primary Care Provider.   ________________________________________________________  The [facility] providers would like to encourage you to use MYCHART to communicate with providers for non-urgent requests or questions.  Due to long hold times on the telephone, sending your provider a message by [facility] may be a faster and more efficient way to get a response.  Please allow 48 business hours for a response.  Please remember that this is for non-urgent requests.  _______________________________________________________

## 2024-01-04 ENCOUNTER — Encounter: Payer: Self-pay | Admitting: Internal Medicine

## 2024-01-05 ENCOUNTER — Encounter (HOSPITAL_COMMUNITY): Payer: Self-pay

## 2024-01-12 ENCOUNTER — Encounter: Payer: Self-pay | Admitting: Internal Medicine

## 2024-01-12 ENCOUNTER — Ambulatory Visit: Admitting: Internal Medicine

## 2024-01-12 VITALS — BP 118/87 | HR 74 | Temp 98.1°F | Resp 13 | Ht 72.0 in | Wt 174.0 lb

## 2024-01-12 DIAGNOSIS — K648 Other hemorrhoids: Secondary | ICD-10-CM

## 2024-01-12 DIAGNOSIS — Z1211 Encounter for screening for malignant neoplasm of colon: Secondary | ICD-10-CM

## 2024-01-12 MED ORDER — SODIUM CHLORIDE 0.9 % IV SOLN
500.0000 mL | Freq: Once | INTRAVENOUS | Status: DC
Start: 2024-01-12 — End: 2024-01-12

## 2024-01-12 NOTE — Progress Notes (Signed)
 History and Physical Interval Note:  01/12/2024 3:06 PM  Clinton Pope  has presented today for endoscopic procedure(s), with the diagnosis of  Encounter Diagnoses  Name Primary?   Screening for colon cancer Yes   Bloating   .  The various methods of evaluation and treatment have been discussed with the patient and/or family. After consideration of risks, benefits and other options for treatment, the patient has consented to  the endoscopic procedure(s).   The patient's history has been reviewed, patient examined, no change in status, stable for endoscopic procedure(s).  I have reviewed the patient's chart and labs.  Questions were answered to the patient's satisfaction.     Kenney Peacemaker, MD, [facility]

## 2024-01-12 NOTE — Progress Notes (Signed)
 Pt's states no medical or surgical changes since previsit or office visit.

## 2024-01-12 NOTE — Progress Notes (Signed)
 Report given to PACU, vss

## 2024-01-12 NOTE — Patient Instructions (Addendum)
 Your hemorrhoids were a little swollen but overall normal exam. No polyps or cancer were seen.  I appreciate the opportunity to care for you. Kenney Peacemaker, MD, [facility]  Resume previous diet Continue present medications Repeat colonoscopy in 10 years for screening See handout on hemorrhoids  YOU HAD AN ENDOSCOPIC PROCEDURE TODAY AT THE [facility]:   Refer to the procedure report that was given to you for any specific questions about what was found during the examination.  If the procedure report does not answer your questions, please call your gastroenterologist to clarify.  If you requested that your care partner not be given the details of your procedure findings, then the procedure report has been included in a sealed envelope for you to review at your convenience later.  YOU SHOULD EXPECT: Some feelings of bloating in the abdomen. Passage of more gas than usual.  Walking can help get rid of the air that was put into your GI tract during the procedure and reduce the bloating. If you had a lower endoscopy (such as a colonoscopy or flexible sigmoidoscopy) you may notice spotting of blood in your stool or on the toilet paper. If you underwent a bowel prep for your procedure, you may not have a normal bowel movement for a few days.  Please Note:  You might notice some irritation and congestion in your nose or some drainage.  This is from the oxygen used during your procedure.  There is no need for concern and it should clear up in a day or so.  SYMPTOMS TO REPORT IMMEDIATELY:  Following lower endoscopy (colonoscopy or flexible sigmoidoscopy):  Excessive amounts of blood in the stool  Significant tenderness or worsening of abdominal pains  Swelling of the abdomen that is new, acute  Fever of 100F or higher  For urgent or emergent issues, a gastroenterologist can be reached at any hour by calling [phone]. Do not use MyChart messaging for urgent concerns.   DIET:  We do  recommend a small meal at first, but then you may proceed to your regular diet.  Drink plenty of fluids but you should avoid alcoholic beverages for 24 hours.  ACTIVITY:  You should plan to take it easy for the rest of today and you should NOT DRIVE or use heavy machinery until tomorrow (because of the sedation medicines used during the test).    FOLLOW UP: Our staff will call the number listed on your records the next business day following your procedure.  We will call around 7:15- 8:00 am to check on you and address any questions or concerns that you may have regarding the information given to you following your procedure. If we do not reach you, we will leave a message.     If any biopsies were taken you will be contacted by phone or by letter within the next 1-3 weeks.  Please call us  at [phone] if you have not heard about the biopsies in 3 weeks.   SIGNATURES/CONFIDENTIALITY: You and/or your care partner have signed paperwork which will be entered into your electronic medical record.  These signatures attest to the fact that that the information above on your After Visit Summary has been reviewed and is understood.  Full responsibility of the confidentiality of this discharge information lies with you and/or your care-partner.

## 2024-01-12 NOTE — Op Note (Signed)
 [facility] Patient Name: Darico Villalona Procedure Date: 01/12/2024 3:09 PM MRN: [id number] Endoscopist: Kenney Peacemaker , MD, [id number] Age: 53 Referring MD:  Date of Birth: 05-11-1971 Gender: Male Account #: [id number] Procedure:                Colonoscopy Indications:              Screening for colorectal malignant neoplasm, This                            is the patient's first colonoscopy Medicines:                Monitored Anesthesia Care Procedure:                Pre-Anesthesia Assessment:                           - Prior to the procedure, a History and Physical                            was performed, and patient medications and                            allergies were reviewed. The patient's tolerance of                            previous anesthesia was also reviewed. The risks                            and benefits of the procedure and the sedation                            options and risks were discussed with the patient.                            All questions were answered, and informed consent                            was obtained. Prior Anticoagulants: The patient has                            taken no anticoagulant or antiplatelet agents. ASA                            Grade Assessment: I - A normal, healthy patient.                            After reviewing the risks and benefits, the patient                            was deemed in satisfactory condition to undergo the                            procedure.  After obtaining informed consent, the colonoscope                            was passed under direct vision. Throughout the                            procedure, the patient's blood pressure, pulse, and                            oxygen saturations were monitored continuously. The                            CF [id number] #[id number] was introduced through the anus                            and advanced to the the cecum,  identified by                            appendiceal orifice and ileocecal valve. The bowel                            preparation used was SUPREP via split dose                            instruction. The quality of the bowel preparation                            was good. The ileocecal valve, appendiceal orifice,                            and rectum were photographed. The colonoscopy was                            somewhat difficult due to significant looping.                            Successful completion of the procedure was aided by                            straightening and shortening the scope to obtain                            bowel loop reduction and applying abdominal                            pressure. The patient tolerated the procedure well. Scope In: 3:19:22 PM Scope Out: 3:32:04 PM Scope Withdrawal Time: 0 hours 8 minutes 34 seconds  Total Procedure Duration: 0 hours 12 minutes 42 seconds  Findings:                 The perianal and digital rectal examinations were                            normal.  Internal hemorrhoids were found.                           The exam was otherwise without abnormality on                            direct and retroflexion views. Complications:            No immediate complications. Estimated Blood Loss:     Estimated blood loss: none. Impression:               - Internal hemorrhoids.                           - The examination was otherwise normal on direct                            and retroflexion views.                           - No specimens collected. Recommendation:           - Patient has a contact number available for                            emergencies. The signs and symptoms of potential                            delayed complications were discussed with the                            patient. Return to normal activities tomorrow.                            Written discharge instructions were  provided to the                            patient.                           - Resume previous diet.                           - Continue present medications.                           - Repeat colonoscopy in 10 years for screening                            purposes. Kenney Peacemaker, MD 01/12/2024 3:37:08 PM This report has been signed electronically.

## 2024-01-15 ENCOUNTER — Telehealth: Payer: Self-pay | Admitting: *Deleted

## 2024-01-15 ENCOUNTER — Other Ambulatory Visit: Payer: Self-pay | Admitting: Acute Care

## 2024-01-15 DIAGNOSIS — Z122 Encounter for screening for malignant neoplasm of respiratory organs: Secondary | ICD-10-CM

## 2024-01-15 DIAGNOSIS — Z87891 Personal history of nicotine dependence: Secondary | ICD-10-CM

## 2024-01-15 NOTE — Telephone Encounter (Signed)
 Attempted post procedure follow up call.  No answer - LVM.

## 2024-01-29 ENCOUNTER — Encounter: Payer: Self-pay | Admitting: Acute Care

## 2024-02-20 ENCOUNTER — Encounter: Payer: Self-pay | Admitting: Family Medicine

## 2024-02-20 ENCOUNTER — Ambulatory Visit: Admitting: Family Medicine

## 2024-02-20 VITALS — BP 124/82 | HR 100 | Temp 97.8°F | Ht 72.0 in | Wt 175.2 lb

## 2024-02-20 DIAGNOSIS — E7800 Pure hypercholesterolemia, unspecified: Secondary | ICD-10-CM | POA: Diagnosis not present

## 2024-02-20 DIAGNOSIS — Z23 Encounter for immunization: Secondary | ICD-10-CM | POA: Diagnosis not present

## 2024-02-20 NOTE — Progress Notes (Signed)
   Established Patient Office Visit   Subjective:  Patient ID: Clinton Pope, male    DOB: 11-03-1970  Age: 53 y.o. MRN: [id number]  Chief Complaint  Patient presents with   Medical Management of Chronic Issues    3 month follow up. Pt is fasting. Pt getting 2nd Shingles today.     HPI Encounter Diagnoses  Name Primary?   Immunization due Yes   Elevated LDL cholesterol level    Here for second shingles vaccine.  Experienced chills the evening after his first dose.  Status post normal colonoscopy.   Review of Systems  Constitutional: Negative.   HENT: Negative.    Eyes:  Negative for blurred vision, discharge and redness.  Respiratory: Negative.    Cardiovascular: Negative.   Gastrointestinal:  Negative for abdominal pain.  Genitourinary: Negative.   Musculoskeletal: Negative.  Negative for myalgias.  Skin:  Negative for rash.  Neurological:  Negative for tingling, loss of consciousness and weakness.  Endo/Heme/Allergies:  Negative for polydipsia.    No current outpatient medications on file.   Objective:     BP 124/82 (Cuff Size: Normal)   Pulse 100   Temp 97.8 F (36.6 C) (Temporal)   Ht 6' (1.829 m)   Wt 175 lb 3.2 oz (79.5 kg)   SpO2 96%   BMI 23.76 kg/m  BP Readings from Last 3 Encounters:  02/20/24 124/82  01/12/24 118/87  12/28/23 (!) 122/94      Physical Exam Constitutional:      General: He is not in acute distress.    Appearance: Normal appearance. He is not ill-appearing, toxic-appearing or diaphoretic.  HENT:     Head: Normocephalic and atraumatic.     Right Ear: External ear normal.     Left Ear: External ear normal.   Eyes:     General: No scleral icterus.       Right eye: No discharge.        Left eye: No discharge.     Extraocular Movements: Extraocular movements intact.     Conjunctiva/sclera: Conjunctivae normal.   Pulmonary:     Effort: Pulmonary effort is normal. No respiratory distress.   Skin:    General: Skin is warm  and dry.   Neurological:     Mental Status: He is alert and oriented to person, place, and time.   Psychiatric:        Mood and Affect: Mood normal.        Behavior: Behavior normal.      No results found for any visits on 02/20/24.    The 10-year ASCVD risk score (Arnett DK, et al., 2019) is: 6.8%    Assessment & Plan:   Immunization due -     Varicella-zoster vaccine IM  Elevated LDL cholesterol level    Return in about 1 year (around 02/19/2025) for annual physical.  Information given on the Mediterranean diet.  Elsie Sim Lent, MD

## 2024-06-21 DIAGNOSIS — M791 Myalgia, unspecified site: Secondary | ICD-10-CM | POA: Diagnosis not present

## 2024-06-21 DIAGNOSIS — J029 Acute pharyngitis, unspecified: Secondary | ICD-10-CM | POA: Diagnosis not present

## 2024-06-21 DIAGNOSIS — R6883 Chills (without fever): Secondary | ICD-10-CM | POA: Diagnosis not present

## 2024-06-21 DIAGNOSIS — J042 Acute laryngotracheitis: Secondary | ICD-10-CM | POA: Diagnosis not present

## 2024-06-21 DIAGNOSIS — U071 COVID-19: Secondary | ICD-10-CM | POA: Diagnosis not present
